# Patient Record
Sex: Female | Born: 1955 | Race: White | Hispanic: No | Marital: Married | Smoking: Never smoker
Health system: Southern US, Community
[De-identification: ages and names within clinical notes are randomized; demographics above are authoritative.]

## PROBLEM LIST (undated history)

## (undated) DIAGNOSIS — E079 Disorder of thyroid, unspecified: Secondary | ICD-10-CM

## (undated) HISTORY — PX: SPINE SURGERY: SHX786

## (undated) HISTORY — PX: OTHER SURGICAL HISTORY: SHX169

## (undated) HISTORY — DX: Disorder of thyroid, unspecified: E07.9

## (undated) HISTORY — PX: UTERINE FIBROID SURGERY: SHX826

---

## 1999-04-20 ENCOUNTER — Encounter: Payer: Self-pay | Admitting: Obstetrics and Gynecology

## 1999-04-20 ENCOUNTER — Ambulatory Visit (HOSPITAL_COMMUNITY): Admission: RE | Admit: 1999-04-20 | Discharge: 1999-04-20 | Payer: Self-pay | Admitting: Obstetrics and Gynecology

## 1999-12-16 ENCOUNTER — Ambulatory Visit (HOSPITAL_COMMUNITY): Admission: RE | Admit: 1999-12-16 | Discharge: 1999-12-16 | Payer: Self-pay

## 2000-02-10 ENCOUNTER — Encounter: Payer: Self-pay | Admitting: Gastroenterology

## 2000-02-10 ENCOUNTER — Ambulatory Visit (HOSPITAL_COMMUNITY): Admission: RE | Admit: 2000-02-10 | Discharge: 2000-02-10 | Payer: Self-pay | Admitting: Gastroenterology

## 2000-02-20 ENCOUNTER — Encounter (INDEPENDENT_AMBULATORY_CARE_PROVIDER_SITE_OTHER): Payer: Self-pay

## 2000-02-20 ENCOUNTER — Inpatient Hospital Stay (HOSPITAL_COMMUNITY): Admission: RE | Admit: 2000-02-20 | Discharge: 2000-02-22 | Payer: Self-pay | Admitting: Obstetrics and Gynecology

## 2000-05-09 ENCOUNTER — Encounter: Payer: Self-pay | Admitting: Gastroenterology

## 2000-05-09 ENCOUNTER — Ambulatory Visit (HOSPITAL_COMMUNITY): Admission: RE | Admit: 2000-05-09 | Discharge: 2000-05-09 | Payer: Self-pay | Admitting: Physical Therapy

## 2000-06-21 ENCOUNTER — Encounter: Payer: Self-pay | Admitting: Obstetrics and Gynecology

## 2000-06-21 ENCOUNTER — Ambulatory Visit (HOSPITAL_COMMUNITY): Admission: RE | Admit: 2000-06-21 | Discharge: 2000-06-21 | Payer: Self-pay | Admitting: Obstetrics and Gynecology

## 2001-04-22 ENCOUNTER — Encounter: Payer: Self-pay | Admitting: Gastroenterology

## 2001-04-22 ENCOUNTER — Ambulatory Visit (HOSPITAL_COMMUNITY): Admission: RE | Admit: 2001-04-22 | Discharge: 2001-04-22 | Payer: Self-pay | Admitting: Gastroenterology

## 2003-07-15 ENCOUNTER — Other Ambulatory Visit: Admission: RE | Admit: 2003-07-15 | Discharge: 2003-07-15 | Payer: Self-pay | Admitting: Obstetrics and Gynecology

## 2004-08-02 ENCOUNTER — Other Ambulatory Visit: Admission: RE | Admit: 2004-08-02 | Discharge: 2004-08-02 | Payer: Self-pay | Admitting: Obstetrics and Gynecology

## 2004-12-30 ENCOUNTER — Encounter: Admission: RE | Admit: 2004-12-30 | Discharge: 2004-12-30 | Payer: Self-pay | Admitting: Gastroenterology

## 2005-03-01 ENCOUNTER — Ambulatory Visit (HOSPITAL_COMMUNITY): Admission: RE | Admit: 2005-03-01 | Discharge: 2005-03-01 | Payer: Self-pay | Admitting: Gastroenterology

## 2005-06-22 ENCOUNTER — Encounter (HOSPITAL_COMMUNITY): Admission: RE | Admit: 2005-06-22 | Discharge: 2005-09-20 | Payer: Self-pay | Admitting: Endocrinology

## 2005-08-16 ENCOUNTER — Other Ambulatory Visit: Admission: RE | Admit: 2005-08-16 | Discharge: 2005-08-16 | Payer: Self-pay | Admitting: Obstetrics and Gynecology

## 2005-09-02 ENCOUNTER — Emergency Department (HOSPITAL_COMMUNITY): Admission: EM | Admit: 2005-09-02 | Discharge: 2005-09-02 | Payer: Self-pay | Admitting: Emergency Medicine

## 2007-02-21 ENCOUNTER — Observation Stay (HOSPITAL_COMMUNITY): Admission: AD | Admit: 2007-02-21 | Discharge: 2007-02-22 | Payer: Self-pay | Admitting: Obstetrics and Gynecology

## 2007-02-21 ENCOUNTER — Ambulatory Visit (HOSPITAL_COMMUNITY): Admission: RE | Admit: 2007-02-21 | Discharge: 2007-02-21 | Payer: Self-pay | Admitting: Obstetrics and Gynecology

## 2007-02-21 ENCOUNTER — Encounter (INDEPENDENT_AMBULATORY_CARE_PROVIDER_SITE_OTHER): Payer: Self-pay | Admitting: Specialist

## 2008-09-11 ENCOUNTER — Encounter: Admission: RE | Admit: 2008-09-11 | Discharge: 2008-09-11 | Payer: Self-pay | Admitting: Family Medicine

## 2008-09-26 ENCOUNTER — Encounter: Admission: RE | Admit: 2008-09-26 | Discharge: 2008-09-26 | Payer: Self-pay | Admitting: Neurological Surgery

## 2008-10-30 ENCOUNTER — Ambulatory Visit (HOSPITAL_COMMUNITY): Admission: RE | Admit: 2008-10-30 | Discharge: 2008-10-31 | Payer: Self-pay | Admitting: Neurological Surgery

## 2008-12-01 ENCOUNTER — Encounter: Admission: RE | Admit: 2008-12-01 | Discharge: 2008-12-01 | Payer: Self-pay | Admitting: Neurological Surgery

## 2009-02-01 ENCOUNTER — Encounter: Admission: RE | Admit: 2009-02-01 | Discharge: 2009-02-01 | Payer: Self-pay | Admitting: Neurological Surgery

## 2009-02-08 ENCOUNTER — Encounter: Admission: RE | Admit: 2009-02-08 | Discharge: 2009-02-08 | Payer: Self-pay | Admitting: Neurological Surgery

## 2009-05-21 ENCOUNTER — Encounter: Admission: RE | Admit: 2009-05-21 | Discharge: 2009-08-19 | Payer: Self-pay | Admitting: Neurological Surgery

## 2009-08-30 ENCOUNTER — Encounter: Admission: RE | Admit: 2009-08-30 | Discharge: 2009-08-30 | Payer: Self-pay | Admitting: Neurological Surgery

## 2010-11-11 ENCOUNTER — Encounter: Payer: Self-pay | Admitting: Physician Assistant

## 2010-11-13 ENCOUNTER — Encounter: Payer: Self-pay | Admitting: Family Medicine

## 2010-11-23 ENCOUNTER — Ambulatory Visit: Admit: 2010-11-23 | Payer: Self-pay

## 2010-11-23 ENCOUNTER — Encounter: Payer: Self-pay | Admitting: Physician Assistant

## 2010-11-23 ENCOUNTER — Ambulatory Visit: Admit: 2010-11-23 | Payer: Self-pay | Admitting: Physician Assistant

## 2010-12-01 DIAGNOSIS — E039 Hypothyroidism, unspecified: Secondary | ICD-10-CM | POA: Insufficient documentation

## 2010-12-02 ENCOUNTER — Encounter: Payer: Self-pay | Admitting: Physician Assistant

## 2010-12-02 ENCOUNTER — Encounter: Payer: Self-pay | Admitting: Cardiovascular Disease

## 2010-12-02 ENCOUNTER — Encounter (INDEPENDENT_AMBULATORY_CARE_PROVIDER_SITE_OTHER): Payer: Managed Care, Other (non HMO) | Admitting: Physician Assistant

## 2010-12-02 ENCOUNTER — Encounter (INDEPENDENT_AMBULATORY_CARE_PROVIDER_SITE_OTHER): Payer: Managed Care, Other (non HMO)

## 2010-12-02 DIAGNOSIS — Z8249 Family history of ischemic heart disease and other diseases of the circulatory system: Secondary | ICD-10-CM

## 2010-12-02 DIAGNOSIS — R0989 Other specified symptoms and signs involving the circulatory and respiratory systems: Secondary | ICD-10-CM

## 2010-12-07 ENCOUNTER — Encounter: Payer: Self-pay | Admitting: Cardiovascular Disease

## 2010-12-08 NOTE — Miscellaneous (Signed)
Summary: Orders Update  Clinical Lists Changes  Problems: Added new problem of CAROTID BRUIT (ICD-785.9) Orders: Added new Test order of Carotid Duplex (Carotid Duplex) - Signed 

## 2011-02-06 LAB — CBC
HCT: 41.7 % (ref 36.0–46.0)
Hemoglobin: 13.8 g/dL (ref 12.0–15.0)
MCV: 88.7 fL (ref 78.0–100.0)
Platelets: 187 10*3/uL (ref 150–400)
WBC: 6.5 10*3/uL (ref 4.0–10.5)

## 2011-02-06 LAB — DIFFERENTIAL
Lymphocytes Relative: 29 % (ref 12–46)
Monocytes Absolute: 0.3 10*3/uL (ref 0.1–1.0)
Monocytes Relative: 4 % (ref 3–12)
Neutro Abs: 4.1 10*3/uL (ref 1.7–7.7)

## 2011-02-06 LAB — BASIC METABOLIC PANEL
BUN: 14 mg/dL (ref 6–23)
CO2: 25 mEq/L (ref 19–32)
Calcium: 9.2 mg/dL (ref 8.4–10.5)
Glucose, Bld: 120 mg/dL — ABNORMAL HIGH (ref 70–99)
Potassium: 4 mEq/L (ref 3.5–5.1)
Sodium: 135 mEq/L (ref 135–145)

## 2011-02-06 LAB — APTT: aPTT: 33 seconds (ref 24–37)

## 2011-02-06 LAB — PROTIME-INR: Prothrombin Time: 13.7 seconds (ref 11.6–15.2)

## 2011-03-07 NOTE — Op Note (Signed)
Christina Barnes, Christina Barnes                 ACCOUNT NO.:  [id number]   MEDICAL RECORD NO.:  [id number]          PATIENT TYPE:  OIB   LOCATION:  3526                         FACILITY:  [facility]   PHYSICIAN:  Tia Alert, MD     DATE OF BIRTH:  1956/02/11   DATE OF PROCEDURE:  10/30/2008  DATE OF DISCHARGE:                               OPERATIVE REPORT   PREOPERATIVE DIAGNOSES:  Cervical spondylosis with neural foraminal  stenosis C5-6 with neck and left arm pain.   POSTOPERATIVE DIAGNOSIS:  Cervical spondylosis with neural foraminal  stenosis C5-6 with neck and left arm pain.   PROCEDURES:  1. Decompressive anterior cervical diskectomy C5-6.  2. Anterior cervical arthrodesis C5-6 utilizing a 6-mm PEEK interbody      cage packed with local autograft and Actifuse putty and Trinity      Plus.  3. Anterior cervical plating C5-6 utilizing a 23-mm Atlantis Venture      plate.   SURGEON:  Tia Alert, MD   ASSISTANT:  Kathaleen Maser. Pool, MD   ANESTHESIA:  General endotracheal.   COMPLICATIONS:  None apparent.   INDICATIONS FOR THE PROCEDURE:  [name] is a 55 year old female who  presented with neck pain with severe left arm pain.  She had an MRI,  which showed cervical spondylosis at C5-6 with neural foraminal  stenosis.  She had an MRI of the shoulder, which was normal.  She tried  medical management for a quite some time without significant relief, so  I recommended an anterior cervical diskectomy, fusion, and plating C5-6  in hopes of improving her pain syndrome.  She understood the risks,  benefits, and expected outcome and wished to proceed.   DESCRIPTION OF THE PROCEDURE:  The patient was taken to the operating  room, and after induction of adequate generalized endotracheal  anesthesia, she was placed in supine position on the operating room  table.  Her right anterior cervical region was prepped with DuraPrep and  then draped in the usual sterile fashion.  A 5 mL of local  anesthesia  was injected and a transverse incision was made to the right of midline  and carried down to the platysma, which was elevated, opened, and  undermined with Metzenbaum scissors.  I then dissected in a plane medial  to the sternocleidomastoid muscle and internal carotid artery and  lateral to the trachea and esophagus to expose C5-6.  Intraoperative  fluoroscopy confirmed my level at C5-6 and then the longus colli muscles  were taken down and the Shadow-Line retractors were placed under this to  expose C5-6.  The annulus was incised and the initial diskectomy was  done with pituitary rongeurs and curved curettes.  The anterior-inferior  endplate of C5 was bitten away with a 3-mm Kerrison punch.  We then used  a high-speed drill to drill the endplates for later arthrodesis.  The  drill shavings were saved in a mucous trap for later arthrodesis.  We  drilled down to the level of the posterior longitudinal ligament,  brought in the operating microscope.  The ligament was  opened and then  removed in a circumferential fashion undercutting the bodies of C5 and  C6.  Bilateral foraminotomies were performed marching along the C6  endplate to identify the pedicles bilaterally and then marched along the  nerve root distally into the foramina.  Once our decompression was  complete, we palpated in a circumferential fashion with nerve hook to  assure adequate decompression.  The nerve hook passed easily into the  neural foramina.  We then measured the interspace to be 6 mm.  We used a  6-mm PEEK interbody cage, packed with local autograft, Actifuse putty,  and Trinity Plus and tapped this into position at C5-6.  We then used a  23-mm Atlantis Venture plate, placed two 13-mm variable angle screws in  the bodies of C5 and C6 and these locked into plate by locking mechanism  within the plate.  We then dried all bleeding points with bipolar  cautery, irrigated with saline solution containing  bacitracin.  Once  meticulous hemostasis was achieved, we closed the platysma with 3-0  Vicryl, closed the subcuticular tissue with 3-0 Vicryl, and closed the  skin with benzoin and Steri-Strips.  The drapes were removed.  Sterile  dressing was applied.  The patient was awakened from general anesthesia  and transferred to the recovery room in stable condition.  At the end of  the procedure, all sponge, needle, and instrument counts were correct.      Tia Alert, MD  Electronically Signed     DSJ/MEDQ  D:  10/30/2008  T:  10/30/2008  Job:  [id number]

## 2011-03-10 NOTE — H&P (Signed)
NAME:  Christina Barnes, Christina Barnes                 ACCOUNT NO.:  [id number]   MEDICAL RECORD NO.:  [id number]          PATIENT TYPE:  AMB   LOCATION:  SDC                           FACILITY:  [facility]   PHYSICIAN:  Guy Sandifer. Henderson Cloud, M.D. DATE OF BIRTH:  28-Mar-1956   DATE OF ADMISSION:  DATE OF DISCHARGE:                              HISTORY & PHYSICAL   CHIEF COMPLAINT:  Heavy menses and desires permanent sterilization.   HISTORY OF PRESENT ILLNESS:  This patient is a 55 year old married white  female, G1, P1, who is having progressively heavier menses.  Ultrasound  in my office on December 24, 2006, is consistent with a uterus measuring  10.5 x 4.5 x 6.0 cm.  There were multiple uterine leiomyomata ranging  from 11 to 30 mm.  The ovaries appeared normal.  Sonohysterogram  revealed a 2.6 cm polypoid-type mass in the endometrial canal.  The  patient also desires permanent sterilization.  After discussion of the  options, she is being admitted for laparoscopy with bilateral tubal  ligation with Filshie clips, hysteroscopy, dilatation and curettage and  NovaSure endometrial ablation.  The potential risks and complications  have been discussed with the patient preoperatively.   PAST MEDICAL HISTORY:  Hypothyroidism.   PAST SURGICAL HISTORY:  Negative.   FAMILY HISTORY:  Positive for renal carcinoma in mother.   OBSTETRICAL HISTORY:  Vaginal delivery x1.   MEDICATIONS:  1. Birth control pill daily.  2. Synthroid 0.137 mg daily.   ALLERGIES:  PENICILLIN leading to hives.   SOCIAL HISTORY:  Denies tobacco, alcohol or drug abuse.   REVIEW OF SYSTEMS:  NEUROLOGIC:  Denies headache.  CARDIAC:  Denies  chest pain.  PULMONARY:  Denies shortness of breath.  GI:  Denies recent  changes in bowel habits.   PHYSICAL EXAMINATION:  VITAL SIGNS:  Height 5 feet 0 inches.  Weight 140  pounds.  Blood pressure 100/60.  HEENT:  Without thyromegaly.  LUNGS:  Clear to auscultation.  HEART:  Regular rate and rhythm.  BACK:  Without CVA tenderness.  BREASTS:  Without mass, retraction or discharge.  ABDOMEN:  Soft, nontender, without masses.  PELVIC:  Vulva, vagina and cervix without lesion.  The uterus is about 8  weeks in size.  Adnexa nontender without palpable masses.  EXTREMITIES:  Grossly within normal limits.  NEUROLOGIC:  Grossly within normal limits.   ASSESSMENT:  1. Menorrhagia.  2. Desires permanent sterilization.   PLAN:  Laparoscopy, bilateral tubal ligation, hysteroscopy, dilatation  and curettage, and NovaSure endometrial ablation.      Guy Sandifer Henderson Cloud, M.D.  Electronically Signed     JET/MEDQ  D:  02/19/2007  T:  02/19/2007  Job:  [id number]

## 2011-03-10 NOTE — Op Note (Signed)
NAME:  Christina Barnes, Christina Barnes                 ACCOUNT NO.:  [id number]   MEDICAL RECORD NO.:  [id number]          PATIENT TYPE:  AMB   LOCATION:  SDC                           FACILITY:  [facility]   PHYSICIAN:  Guy Sandifer. Henderson Cloud, M.D. DATE OF BIRTH:  01/14/1956   DATE OF PROCEDURE:  02/21/2007  DATE OF DISCHARGE:                               OPERATIVE REPORT   PREOPERATIVE DIAGNOSES:  1. Menorrhagia.  2. Desires permanent sterilization.   POSTOPERATIVE DIAGNOSES:  1. Menorrhagia.  2. Desires permanent sterilization.   PROCEDURE:  NovaSure endometrial ablation, hysteroscopy with resection  of endometrial polyps, dilatation and curettage, laparoscopy with  bilateral tubal ligation with Filshie clips, lysis of adhesions, and 1%  Xylocaine paracervical block.   SURGEON:  Guy Sandifer. Henderson Cloud, M.D.   ANESTHESIA:  General with endotracheal intubation by Belva Agee,  M.D.   SPECIMENS:  Endometrial curettings and endometrial polyps, all to  pathology.   ESTIMATED BLOOD LOSS:  Minimal.   IN'S AND OUT'S OF DISTENDING MEDIA:  200 mL deficit, with most of that  on the floor.   INDICATIONS AND CONSENT:  This patient is a 55 year old married white  female G1, P1 with progressively heavier menses.  Details are dictated  in the history and physical.  She desires permanent sterilization.  Alternate methods have been reviewed.  The potential risks and  complications have been discussed preoperatively, including but limited  to, infection, uterine perforation, organ damage, bleeding requiring  transfusion of blood products with possible transfusion reaction, HIV  and hepatitis acquisition, DVT, PE, and pneumonia.  Permanence, failure  rate, and increased ectopic risk of tubal ligation has been reviewed.  Success and failure rate of the endometrial ablation has been reviewed.  All questions have been answered, and consent is signed on the chart.   FINDINGS:  There is a 2.5-cm polypoid structure arising  from the right  posterior mid-endometrial canal.  There is an 8-mm fleshy polypoid  process in front of the left fallopian tube ostia.  Abdominally, the  uterus contains several 5 to 10-mm subserosal leiomyomata.  The left  tube and ovary is normal.  The anterior cul-de-sac is normal.  The right  fallopian tube is adherent over the surface of the right ovary, and the  ovary is adherent to the right pelvic sidewall.   PROCEDURE:  The patient is taken to operating room where she is  identified, placed in the dorsal position, and general anesthesia is  induced via endotracheal intubation.  She is then prepped abdominally  and vaginally.  The bladder is straight catheterized, and she is draped  in a sterile fashion.  A bivalve speculum is placed in the vagina.  The  anterior cervical lip is injected with 1% Xylocaine and grasped with a  single-tooth tenaculum.  A paracervical block is placed at 2, 4, 5, 7,  8, and 10 o'clock positions with approximately 20 mL total of 1% plain  Xylocaine.  The uterus sounds to 10 cm, and the cervical canal sounds to  5 cm.  The cervix is gently progressively dilated.  The  diagnostic  hysteroscope is placed into the endocervical canal and advanced under  direct visualization.  The above findings are noted.  The diagnostic  hysteroscope is withdrawn and replaced with the resectoscope with a  single right-angle wire loop.  Using sorbitol distending media, the  polyps are both resected in a simple fashion without difficulty.  Then  the distending media is changed to LR and is allowed to thoroughly flush  the endometrial cavity.  The hysteroscope is withdrawn, and sharp  curettage is carried out.  The NovaSure device is then placed, and the  endometrial cavity width of 4 cm is noted.  The cavity test is  successfully passed on the first attempt.  Endometrial ablation is then  carried out for a total of 1 minute 23 seconds, and the machine cycles  off in the  normal fashion.  The NovaSure device is removed intact, and a  Hulka tenaculum is placed.   Attention is then turned to the abdomen.  The infraumbilical and  suprapubic areas are injected in the midline with 0.5% plain Marcaine.  A small infraumbilical incision is made.  A disposable Veress needle is  placed on the first attempt, and a normal syringe and drop test is  noted.  2 liters of gas are insufflated under low pressure with good  tympany in the right upper quadrant.  The Veress needle is removed, and  a 10/11 XL bladeless disposable trocar sleeve is placed using direct  visualization with the diagnostic scope.  After placement, it is  switched to the operative laparoscope.  A small suprapubic incision is  made in the midline, and a 5-mm bladeless XL disposable trocar sleeve is  placed under direct visualization without difficulty.  The above  findings are noted.  Sharp and blunt dissection is used to take down the  adhesions around the right adnexa.  This allows identification of the  right fallopian tube from cornu to fimbria.  The adhesions of the right  ovary to the pelvic sidewall are also taken down sharply and bluntly.  This is done clear of the course of the ureter.  The Filshie clip  applier is placed and Filshie clips are placed on the proximal one-third  of the fallopian tube bilaterally.  The Filshie clip applier is then  removed, and bipolar cautery is used to obtain complete hemostasis  around the right fallopian tube where the areas of dissection had been  carried out.  Careful inspection reveals the entire fallopian tube is  within the clip bilaterally, and the heel of the clip can be  transilluminated through the mesosalpinx bilaterally.  Copious  irrigation is carried out, and all return is clear.  The suprapubic  trocar sleeve is removed.  Pneumoperitoneum is reduced, and the  umbilical trocar sleeve is removed.  The umbilical incision is closed with subcuticular  2-0 Vicryl suture.  Dermabond is placed on both  incisions.  The Hulka tenaculum is removed, and no bleeding is noted.  All counts are correct.   The patient is awakened and taken to the recovery room in stable  condition.      Guy Sandifer Henderson Cloud, M.D.  Electronically Signed     JET/MEDQ  D:  02/21/2007  T:  02/21/2007  Job:  [id number]

## 2011-03-10 NOTE — Op Note (Signed)
[facility]  Patient:    Christina Barnes, Christina Barnes                MRN: [id number] Proc. Date: 02/20/00 Adm. Date:  [id number] Attending:  Rhina Brackett                           Operative Report  PREOPERATIVE DIAGNOSIS:  Symptomatic leiomyoma.  POSTOPERATIVE DIAGNOSIS:  Symptomatic leiomyoma.  OPERATION:  Myomectomy.  SURGEON:  Duke Salvia. Marcelle Overlie, M.D.  ASSISTANT:  Guy Sandifer. Arleta Creek, M.D.  ANESTHESIA:  General endotracheal.  COMPLICATIONS:  None.  DRAINS:  Foley catheter.  ESTIMATED BLOOD LOSS:  250 cc.  DESCRIPTION OF PROCEDURE:  The patient was taken to the operating room.  After an adequate level of general endotracheal anesthesia was obtained with the patient in supine position, the abdomen was prepped and draped in the usual manner for sterile abdominal procedures.  Foley catheter was positioned draining clear urine.  She then received preoperative Cefotan 1 gm IV.  A Pfannenstiel incision was made two fingerbreadths above the symphysis and carried down to the fascia which was incised and extended transversely.  The rectus muscles were divided in the midline.  The peritoneum insufflated without incident and extended in a vertical manner.  An OSullivan-OConnor retractor was positioned, bowels packed superiorly out of the field.  Bladder blade was inserted.  Pelvic findings were as follows.  The right uterine cornua posteriorly was enlarged and deviated with a 4 to 5 cm fibroid which was somewhat softened to palpation suggesting it had degenerated.  There was a very small fibroid posteriorly just above the cervix and a 2 cm fibroid anteriorly just above the bladder reflection.  Dilute pitressin solution was injected in the posterior right cornua.  A small incision was made and the degenerated fibroid was excised.  0 Dexon sutures were used for the deep tissue reapproximation and hemostasis with a 3-0 Dexon interrupted on the  serosa.  The small posterior fibroid was just ablated superficially with the Bovie.  The anterior lower segment fibroid, the bladder flap peritoneum was opened and pushed inferiorly and the fibroid was enucleated and sutured with 0 Dexon deep interrupted suture and 3-0 Dexon on the serosal aspect.  These areas were inspected and noted to be hemostatic. There were no other fibroids noted. Tubes and ovaries were normal.  Pelvis was irrigated with saline and aspirated. All operative sites were inspected and noted to be hemostatic.  Interceed was applied across the incision sites on the uterus.  Instruments were removed along with the retractor.  Prior to closure, sponge, needle and instrument counts were reported as correct x 2. Rectus muscles were reapproximated in the midline with a 3-0 Dexon suture. Fascia was closed from laterally to midline using 0 Dexon running suture. Clips and Steri-Strips were used on the skin.  The patient tolerated the procedure well and went to the recovery room in good condition. DD:  02/20/00 TD:  02/20/00 Job: [id number]

## 2011-03-10 NOTE — Discharge Summary (Signed)
[facility]  Patient:    Christina Barnes, Christina Barnes                          MRN: [id number] Adm. Date:  02/20/00 Disc. Date: 02/22/00 Attending:  Duke Salvia. Marcelle Overlie, M.D.                           Discharge Summary  DISCHARGE DIAGNOSES: 1. Symptomatic leiomyoma. 2. Myomectomy this admission.  HISTORY OF PRESENT ILLNESS:  See admission history and physical for details. Briefly, a 55 year old G1, P1 who has had a recent evaluation for pelvic and abdominal pain by her family doctor. During that time, increasing leiomyoma were noted on ultrasound examination. She has had worsening problems with pelvic pain and presents now for myomectomy.  H0OSPITAL COURSE:  On February 20, 2000 under general anesthesia, the patient underwent laparotomy and myomectomy dissecting one large partially degenerated right cornual fibroid and another small anterior serosal fibroid. Both tubes and ovaries appears to be normal at the time. Her postoperative course was uneventful. On the first postoperative day, Feb 21, 2000, she was afebrile, hemoglobin 10.6. Her diet was slowly increased. By the evening of May 2, she was afebrile tolerating a regular diet and had established normal bowel and urinary function again remained afebrile. The incision was clean and dry, no evidence of cellulitis and she was  ready for discharge at that point.  LABORATORY DATA:  The pathology report showed two benign leiomyoma. Her preop hemoglobin was 15.1, postop was 10.6. UPT was negative, urinalysis was negative.  DISPOSITION:  The patient was discharged on Hemocyte once daily for a month. The clips will be left in and she will return to our office in four days to have the clips removed. Tylox p.r.n. pain. I advised her for any incisional redness, drainage, increased pain or bleeding or any fever over 101. She was given other  specific instructions regarding diet, sex, and exercise.  CONDITION ON  DISCHARGE:  Good.  DISCHARGE ACTIVITY:  Graded increase. DD:  02/22/00 TD:  02/25/00 Job: [id number]

## 2011-03-10 NOTE — Op Note (Signed)
NAME:  Christina Barnes, Christina Barnes                 ACCOUNT NO.:  [id number]   MEDICAL RECORD NO.:  [id number]          PATIENT TYPE:  AMB   LOCATION:  ENDO                         FACILITY:  [facility]   PHYSICIAN:  John C. Madilyn Fireman, M.D.    DATE OF BIRTH:  [birth year]   DATE OF PROCEDURE:  03/01/2005  DATE OF DISCHARGE:                                 OPERATIVE REPORT   PROCEDURE:  Colonoscopy.   INDICATIONS FOR PROCEDURE:  Heme-positive stools.   DESCRIPTION OF PROCEDURE:  The patient was placed in the left lateral  decubitus position then placed on the pulse monitor with continuous low-flow  oxygen delivered by nasal cannula. She was sedated with 75 mcg IV fentanyl  and 10 mg IV Versed. The Olympus video colonoscope was inserted into the  rectum and advanced to the cecum, confirmed by transillumination at  McBurney's point and visualization at the ileocecal valve and appendiceal  orifice. The prep was good. The cecum, ascending, transverse, descending and  sigmoid colon all appeared normal with no masses, polyps, diverticula or  other mucosal abnormalities. The rectum likewise appeared normal and  retroflexed view of the anus revealed only small internal hemorrhoids. The  scope was then withdrawn and the patient returned to the recovery room in  stable condition. She tolerated the procedure well and there were no  immediate complications.   IMPRESSION:  Internal hemorrhoids otherwise normal study.   PLAN:  Next colon screening in five years by sigmoidoscopy or colonoscopy.      JCH/MEDQ  D:  03/01/2005  T:  03/01/2005  Job:  [id number]

## 2012-02-23 LAB — HM MAMMOGRAPHY: HM MAMMO: NEGATIVE

## 2012-03-06 ENCOUNTER — Other Ambulatory Visit: Payer: Self-pay | Admitting: Obstetrics and Gynecology

## 2012-10-18 ENCOUNTER — Other Ambulatory Visit: Payer: Self-pay | Admitting: Gastroenterology

## 2012-10-18 DIAGNOSIS — R634 Abnormal weight loss: Secondary | ICD-10-CM

## 2012-10-18 DIAGNOSIS — R109 Unspecified abdominal pain: Secondary | ICD-10-CM

## 2012-10-21 ENCOUNTER — Ambulatory Visit
Admission: RE | Admit: 2012-10-21 | Discharge: 2012-10-21 | Disposition: A | Discharge status: Home / Self Care | Payer: Managed Care, Other (non HMO) | Source: Ambulatory Visit | Attending: Gastroenterology | Admitting: Gastroenterology

## 2012-10-21 DIAGNOSIS — R634 Abnormal weight loss: Secondary | ICD-10-CM

## 2012-10-21 DIAGNOSIS — R109 Unspecified abdominal pain: Secondary | ICD-10-CM

## 2012-10-21 MED ORDER — IOHEXOL 300 MG/ML  SOLN
100.0000 mL | Freq: Once | INTRAMUSCULAR | Status: AC | PRN
  Administered 2012-10-21: 100 mL via INTRAVENOUS

## 2012-11-08 ENCOUNTER — Other Ambulatory Visit: Payer: Self-pay | Admitting: Gastroenterology

## 2013-01-15 ENCOUNTER — Ambulatory Visit: Payer: Self-pay

## 2013-01-17 ENCOUNTER — Ambulatory Visit (INDEPENDENT_AMBULATORY_CARE_PROVIDER_SITE_OTHER): Payer: BC Managed Care – PPO | Admitting: *Deleted

## 2013-01-17 DIAGNOSIS — D649 Anemia, unspecified: Secondary | ICD-10-CM

## 2013-01-17 MED ORDER — CYANOCOBALAMIN 1000 MCG/ML IJ SOLN
1000.0000 ug | INTRAMUSCULAR | Status: DC
  Administered 2013-01-17 – 2013-07-04 (×4): 1000 ug via INTRAMUSCULAR

## 2013-01-24 ENCOUNTER — Ambulatory Visit: Payer: BC Managed Care – PPO

## 2013-02-21 ENCOUNTER — Ambulatory Visit (INDEPENDENT_AMBULATORY_CARE_PROVIDER_SITE_OTHER): Payer: BC Managed Care – PPO | Admitting: *Deleted

## 2013-02-21 DIAGNOSIS — D649 Anemia, unspecified: Secondary | ICD-10-CM

## 2013-02-21 NOTE — Patient Instructions (Addendum)
Vitamin B12 Injections Every person needs vitamin B12. A deficiency develops when the body does not get enough of it. One way to overcome this is by getting B12 shots (injections). A B12 shot puts the vitamin directly into muscle tissue. This avoids any problems your body might have in absorbing it from food or a pill. In some people, the body has trouble using the vitamin correctly. This can cause a B12 deficiency. Not consuming enough of the vitamin can also cause a deficiency. Getting enough vitamin B12 can be hard for elderly people. Sometimes, they do not eat a well-balanced diet. The elderly are also more likely than younger people to have medical conditions or take medications that can lead to a deficiency. WHAT DOES VITAMIN B12 DO? Vitamin B12 does many things to help the body work right:  It helps the body make healthy red blood cells.  It helps maintain nerve cells.  It is involved in the body's process of converting food into energy (metabolism).  It is needed to make the genetic material in all cells (DNA). VITAMIN B12 FOOD SOURCES Most people get plenty of vitamin B12 through the foods they eat. It is present in:  Meat, fish, poultry, and eggs.  Milk and milk products.  It also is added when certain foods are made, including some breads, cereals and yogurts. The food is then called "fortified". CAUSES The most common causes of vitamin B12 deficiency are:  Pernicious anemia. The condition develops when the body cannot make enough healthy red blood cells. This stems from a lack of a protein made in the stomach (intrinsic factor). People without this protein cannot absorb enough vitamin B12 from food.  Malabsorption. This is when the body cannot absorb the vitamin. It can be caused by:  Pernicious anemia.  Surgery to remove part or all of the stomach can lead to malabsorption. Removal of part or all of the small intestine can also cause malabsorption.  Vegetarian diet.  People who are strict about not eating foods from animals could have trouble taking in enough vitamin B12 from diet alone.  Medications. Some medicines have been linked to B12 deficiency, such as Metformin (a drug prescribed for type 2 diabetes). Long-term use of stomach acid suppressants also can keep the vitamin from being absorbed.  Intestinal problems such as inflammatory bowel disease. If there are problems in the digestive tract, vitamin B12 may not be absorbed in good enough amounts. SYMPTOMS People who do not get enough B12 can develop problems. These can include:  Anemia. This is when the body has too few red blood cells. Red blood cells carry oxygen to the rest of the body. Without a healthy supply of red blood cells, people can feel:  Tired (fatigued).  Weak.  Severe anemia can cause:  Shortness of breath.  Dizziness.  Rapid heart rate.  Paleness.  Other Vitamin B12 deficiency symptoms include:  Diarrhea.  Numbness or tingling in the hands or feet.  Loss of appetite.  Confusion.  Sores on the tongue or in the mouth. LET YOUR CAREGIVER KNOW ABOUT:  Any allergies. It is very important to know if you are allergic or sensitive to cobalt. Vitamin B12 contains cobalt.  Any history of kidney disease.  All medications you are taking. Include prescription and over-the-counter medicines, herbs and creams.  Whether you are pregnant or breast-feeding.  If you have Leber's disease, a hereditary eye condition, vitamin B12 could make it worse. RISKS AND COMPLICATIONS Reactions to an injection are   usually temporary. They might include:  Pain at the injection site.  Redness, swelling or tenderness at the site.  Headache, dizziness or weakness.  Nausea, upset stomach or diarrhea.  Numbness or tingling.  Fever.  Joint pain.  Itching or rash. If a reaction does not go away in a short while, talk with your healthcare provider. A change in the way the shots are  given, or where they are given, might need to be made. BEFORE AN INJECTION To decide whether B12 injections are right for you, your healthcare provider will probably:  Ask about your medical history.  Ask questions about your diet.  Ask about symptoms such as:  Have you felt weak?  Do you feel unusually tired?  Do you get dizzy?  Order blood tests. These may include a test to:  Check the level of red cells in your blood.  Measure B12 levels.  Check for the presence of intrinsic factor. VITAMIN B12 INJECTIONS How often you will need a vitamin B12 injection will depend on how severe your deficiency is. This also will affect how long you will need to get them. People with pernicious anemia usually get injections for their entire life. Others might get them for a shorter period. For many people, injections are given daily or weekly for several weeks. Then, once B12 levels are normal, injections are given just once a month. If the cause of the deficiency can be fixed, the injections can be stopped. Talk with your healthcare provider about what you should expect. For an injection:  The injection site will be cleaned with an alcohol swab.  Your healthcare provider will insert a needle directly into a muscle. Most any muscle can be used. Most often, an arm muscle is used. A buttocks muscle can also be used. Many people say shots in that area are less painful.  A small adhesive bandage may be put over the injection site. It usually can be taken off in an hour or less. Injections can be given by your healthcare provider. In some cases, family members give them. Sometimes, people give them to themselves. Talk with your healthcare provider about what would be best for you. If someone other than your healthcare provider will be giving the shots, the person will need to be trained to give them correctly. HOME CARE INSTRUCTIONS   You can remove the adhesive bandage within an hour of getting a  shot.  You should be able to go about your normal activities right away.  Avoid drinking large amounts of alcohol while taking vitamin B12 shots. Alcohol can interfere with the body's use of the vitamin. SEEK MEDICAL CARE IF:   Pain, redness, swelling or tenderness at the injection site does not get better or gets worse.  Headache, dizziness or weakness does not go away.  You develop a fever of more than 100.5 F (38.1 C). SEEK IMMEDIATE MEDICAL CARE IF:   You have chest pain.  You develop shortness of breath.  You have muscle weakness that gets worse.  You develop numbness, weakness or tingling on one side or one area of the body.  You have symptoms of an allergic reaction, such as:  Hives.  Difficulty breathing.  Swelling of the lips, face, tongue or throat.  You develop a fever of more than 102.0 F (38.9 C). MAKE SURE YOU:   Understand these instructions.  Will watch your condition.  Will get help right away if you are not doing well or get worse. Document   Released: 01/05/2009 Document Revised: 01/01/2012 Document Reviewed: 01/05/2009 [employer] Patient Information 2013 [employer].  

## 2013-02-21 NOTE — Progress Notes (Signed)
Patient tolerated well.

## 2013-05-09 ENCOUNTER — Ambulatory Visit (INDEPENDENT_AMBULATORY_CARE_PROVIDER_SITE_OTHER): Payer: BC Managed Care – PPO

## 2013-05-09 DIAGNOSIS — D649 Anemia, unspecified: Secondary | ICD-10-CM

## 2013-05-09 NOTE — Progress Notes (Signed)
Tolerated injection well. 

## 2013-05-09 NOTE — Patient Instructions (Signed)
Vitamin B12 Injections Every person needs vitamin B12. A deficiency develops when the body does not get enough of it. One way to overcome this is by getting B12 shots (injections). A B12 shot puts the vitamin directly into muscle tissue. This avoids any problems your body might have in absorbing it from food or a pill. In some people, the body has trouble using the vitamin correctly. This can cause a B12 deficiency. Not consuming enough of the vitamin can also cause a deficiency. Getting enough vitamin B12 can be hard for elderly people. Sometimes, they do not eat a well-balanced diet. The elderly are also more likely than younger people to have medical conditions or take medications that can lead to a deficiency. WHAT DOES VITAMIN B12 DO? Vitamin B12 does many things to help the body work right:  It helps the body make healthy red blood cells.  It helps maintain nerve cells.  It is involved in the body's process of converting food into energy (metabolism).  It is needed to make the genetic material in all cells (DNA). VITAMIN B12 FOOD SOURCES Most people get plenty of vitamin B12 through the foods they eat. It is present in:  Meat, fish, poultry, and eggs.  Milk and milk products.  It also is added when certain foods are made, including some breads, cereals and yogurts. The food is then called "fortified". CAUSES The most common causes of vitamin B12 deficiency are:  Pernicious anemia. The condition develops when the body cannot make enough healthy red blood cells. This stems from a lack of a protein made in the stomach (intrinsic factor). People without this protein cannot absorb enough vitamin B12 from food.  Malabsorption. This is when the body cannot absorb the vitamin. It can be caused by:  Pernicious anemia.  Surgery to remove part or all of the stomach can lead to malabsorption. Removal of part or all of the small intestine can also cause malabsorption.  Vegetarian diet.  People who are strict about not eating foods from animals could have trouble taking in enough vitamin B12 from diet alone.  Medications. Some medicines have been linked to B12 deficiency, such as Metformin (a drug prescribed for type 2 diabetes). Long-term use of stomach acid suppressants also can keep the vitamin from being absorbed.  Intestinal problems such as inflammatory bowel disease. If there are problems in the digestive tract, vitamin B12 may not be absorbed in good enough amounts. SYMPTOMS People who do not get enough B12 can develop problems. These can include:  Anemia. This is when the body has too few red blood cells. Red blood cells carry oxygen to the rest of the body. Without a healthy supply of red blood cells, people can feel:  Tired (fatigued).  Weak.  Severe anemia can cause:  Shortness of breath.  Dizziness.  Rapid heart rate.  Paleness.  Other Vitamin B12 deficiency symptoms include:  Diarrhea.  Numbness or tingling in the hands or feet.  Loss of appetite.  Confusion.  Sores on the tongue or in the mouth. LET YOUR CAREGIVER KNOW ABOUT:  Any allergies. It is very important to know if you are allergic or sensitive to cobalt. Vitamin B12 contains cobalt.  Any history of kidney disease.  All medications you are taking. Include prescription and over-the-counter medicines, herbs and creams.  Whether you are pregnant or breast-feeding.  If you have Leber's disease, a hereditary eye condition, vitamin B12 could make it worse. RISKS AND COMPLICATIONS Reactions to an injection are   usually temporary. They might include:  Pain at the injection site.  Redness, swelling or tenderness at the site.  Headache, dizziness or weakness.  Nausea, upset stomach or diarrhea.  Numbness or tingling.  Fever.  Joint pain.  Itching or rash. If a reaction does not go away in a short while, talk with your healthcare provider. A change in the way the shots are  given, or where they are given, might need to be made. BEFORE AN INJECTION To decide whether B12 injections are right for you, your healthcare provider will probably:  Ask about your medical history.  Ask questions about your diet.  Ask about symptoms such as:  Have you felt weak?  Do you feel unusually tired?  Do you get dizzy?  Order blood tests. These may include a test to:  Check the level of red cells in your blood.  Measure B12 levels.  Check for the presence of intrinsic factor. VITAMIN B12 INJECTIONS How often you will need a vitamin B12 injection will depend on how severe your deficiency is. This also will affect how long you will need to get them. People with pernicious anemia usually get injections for their entire life. Others might get them for a shorter period. For many people, injections are given daily or weekly for several weeks. Then, once B12 levels are normal, injections are given just once a month. If the cause of the deficiency can be fixed, the injections can be stopped. Talk with your healthcare provider about what you should expect. For an injection:  The injection site will be cleaned with an alcohol swab.  Your healthcare provider will insert a needle directly into a muscle. Most any muscle can be used. Most often, an arm muscle is used. A buttocks muscle can also be used. Many people say shots in that area are less painful.  A small adhesive bandage may be put over the injection site. It usually can be taken off in an hour or less. Injections can be given by your healthcare provider. In some cases, family members give them. Sometimes, people give them to themselves. Talk with your healthcare provider about what would be best for you. If someone other than your healthcare provider will be giving the shots, the person will need to be trained to give them correctly. HOME CARE INSTRUCTIONS   You can remove the adhesive bandage within an hour of getting a  shot.  You should be able to go about your normal activities right away.  Avoid drinking large amounts of alcohol while taking vitamin B12 shots. Alcohol can interfere with the body's use of the vitamin. SEEK MEDICAL CARE IF:   Pain, redness, swelling or tenderness at the injection site does not get better or gets worse.  Headache, dizziness or weakness does not go away.  You develop a fever of more than 100.5 F (38.1 C). SEEK IMMEDIATE MEDICAL CARE IF:   You have chest pain.  You develop shortness of breath.  You have muscle weakness that gets worse.  You develop numbness, weakness or tingling on one side or one area of the body.  You have symptoms of an allergic reaction, such as:  Hives.  Difficulty breathing.  Swelling of the lips, face, tongue or throat.  You develop a fever of more than 102.0 F (38.9 C). MAKE SURE YOU:   Understand these instructions.  Will watch your condition.  Will get help right away if you are not doing well or get worse. Document   Released: 01/05/2009 Document Revised: 01/01/2012 Document Reviewed: 01/05/2009 [employer] Patient Information 2014 [employer].  

## 2013-05-10 ENCOUNTER — Ambulatory Visit (INDEPENDENT_AMBULATORY_CARE_PROVIDER_SITE_OTHER): Payer: BC Managed Care – PPO | Admitting: Family Medicine

## 2013-05-10 VITALS — BP 105/67 | HR 65 | Temp 97.9°F | Wt 119.2 lb

## 2013-05-10 DIAGNOSIS — J019 Acute sinusitis, unspecified: Secondary | ICD-10-CM

## 2013-05-10 DIAGNOSIS — J309 Allergic rhinitis, unspecified: Secondary | ICD-10-CM

## 2013-05-10 DIAGNOSIS — J029 Acute pharyngitis, unspecified: Secondary | ICD-10-CM

## 2013-05-10 LAB — POCT RAPID STREP A (OFFICE): Rapid Strep A Screen: NEGATIVE

## 2013-05-10 MED ORDER — CETIRIZINE HCL 10 MG PO TABS: 10.0000 mg | ORAL_TABLET | Freq: Every day | ORAL | Status: DC

## 2013-05-10 MED ORDER — FLUTICASONE PROPIONATE 50 MCG/ACT NA SUSP: 2.0000 | Freq: Every day | NASAL | Status: DC

## 2013-05-10 MED ORDER — AZITHROMYCIN 250 MG PO TABS: ORAL_TABLET | ORAL | Status: DC

## 2013-05-10 NOTE — Progress Notes (Signed)
Patient ID: Christina Barnes, female   DOB: [birth year], 57 y.o.   MRN: [id number] SUBJECTIVE: CC: Chief Complaint  Patient presents with  . Acute Visit    sore throat congestion 3 -4 days draingage  slight yellow cough alot at night    HPI: As above, with pressure in the cheeks and forehead., severe sorethroat. No fever. No cough, no chest pain, no SOB.  Past Medical History  Diagnosis Date  . Thyroid disease    No past surgical history on file. No current outpatient prescriptions on file prior to visit.   Current Facility-Administered Medications on File Prior to Visit  Medication Dose Route Frequency Provider Last Rate Last Dose  . cyanocobalamin ((VITAMIN B-12)) injection 1,000 mcg  1,000 mcg Intramuscular Q30 days Horald Pollen, PA-C   1,000 mcg at 05/09/13 1533   Allergies  Allergen Reactions  . Augmentin (Amoxicillin-Pot Clavulanate)   . Doxycycline   . Erythromycin   . Penicillins     There is no immunization history on file for this patient. History   Social History  . Marital Status: Married    Spouse Name: N/A    Number of Children: N/A  . Years of Education: N/A   Occupational History  . Not on file.   Social History Main Topics  . Smoking status: Never Smoker   . Smokeless tobacco: Not on file  . Alcohol Use: Not on file  . Drug Use: Not on file  . Sexually Active: Not on file   Other Topics Concern  . Not on file   Social History Narrative  . No narrative on file    ROS: As above in the HPI. All other systems are stable or negative. Was interested in healthy eating  OBJECTIVE: APPEARANCE:  Patient in no acute distress.The patient appeared well nourished and normally developed. Acyanotic. Waist: VITAL SIGNS:BP 105/67  Pulse 65  Temp(Src) 97.9 F (36.6 C) (Oral)  Wt 119 lb 3.2 oz (54.069 kg)  WF  SKIN: warm and  Dry without overt rashes, tattoos and scars  HEAD and Neck: without JVD, Head and scalp: normal Eyes:No scleral  icterus. Fundi normal, eye movements normal. Ears: Auricle normal, canal normal, Tympanic membranes normal, insufflation normal. Nose: congested, boggy swollen nasal mucosa. Paranasal sinus pressure on palpation. Throat: very red, no exudates Neck & thyroid: normal  CHEST & LUNGS: Chest wall: normal Lungs: Clear  CVS: Reveals the PMI to be normally located. Regular rhythm, First and Second Heart sounds are normal,  absence of murmurs, rubs or gallops.  ABDOMEN:  Appearance: normal Benign, no organomegaly, no masses, no Abdominal Aortic enlargement. No Guarding , no rebound. No Bruits. Bowel sounds: normal  RECTAL: N/A GU: N/A  EXTREMETIES: nonedematous. MUSCULOSKELETAL:  Spine: normal Joints: intact  NEUROLOGIC: oriented to time,place and person; nonfocal.  ASSESSMENT: Sore throat - Plan: POCT rapid strep A  Sinusitis, acute - Plan: azithromycin (ZITHROMAX) 250 MG tablet  Allergic rhinitis - Plan: fluticasone (FLONASE) 50 MCG/ACT nasal spray, cetirizine (ZYRTEC) 10 MG tablet  PLAN: Orders Placed This Encounter  Procedures  . POCT rapid strep A   Results for orders placed in visit on 05/10/13  POCT RAPID STREP A (OFFICE)      Result Value Range   Rapid Strep A Screen Negative  Negative    Meds ordered this encounter  Medications  . levothyroxine (SYNTHROID, LEVOTHROID) 112 MCG tablet    Sig: Take 112 mcg by mouth daily before breakfast.  . fluticasone (FLONASE) 50  MCG/ACT nasal spray    Sig: Place 2 sprays into the nose daily.    Dispense:  16 g    Refill:  6  . azithromycin (ZITHROMAX) 250 MG tablet    Sig: Use as directed: 2 on Day 1 ; and 1 tab daily from Day 2 to 5.    Dispense:  6 tablet    Refill:  0  . cetirizine (ZYRTEC) 10 MG tablet    Sig: Take 1 tablet (10 mg total) by mouth daily.    Dispense:  30 tablet    Refill:  5        Dr Woodroe Mode Recommendations  Diet and Exercise discussed with patient.  For nutrition information, I  recommend books:  1).Eat to Live by Dr Monico Hoar. 2).Prevent and Reverse Heart Disease by Dr Suzzette Righter. 3) Dr Katherina Right Book:  Program to Reverse Diabetes  Exercise recommendations are:  If unable to walk, then the patient can exercise in a chair 3 times a day. By flapping arms like a bird gently and raising legs outwards to the front.  If ambulatory, the patient can go for walks for 30 minutes 3 times a week. Then increase the intensity and duration as tolerated.  Goal is to try to attain exercise frequency to 5 times a week.  If applicable: Best to perform resistance exercises (machines or weights) 2 days a week and cardio type exercises 3 days per week.  Return if symptoms worsen or fail to improve.  Arlyce Circle P. [place] Charon, M.D.

## 2013-05-26 ENCOUNTER — Ambulatory Visit (HOSPITAL_COMMUNITY)
Admission: RE | Admit: 2013-05-26 | Discharge: 2013-05-26 | Disposition: A | Discharge status: Home / Self Care | Payer: BC Managed Care – PPO | Source: Ambulatory Visit | Attending: Family Medicine | Admitting: Family Medicine

## 2013-05-26 ENCOUNTER — Ambulatory Visit (INDEPENDENT_AMBULATORY_CARE_PROVIDER_SITE_OTHER): Payer: BC Managed Care – PPO | Admitting: Family Medicine

## 2013-05-26 VITALS — BP 120/64 | HR 70 | Temp 97.5°F | Ht <= 58 in | Wt 119.0 lb

## 2013-05-26 DIAGNOSIS — E039 Hypothyroidism, unspecified: Secondary | ICD-10-CM

## 2013-05-26 DIAGNOSIS — I809 Phlebitis and thrombophlebitis of unspecified site: Secondary | ICD-10-CM

## 2013-05-26 DIAGNOSIS — M7989 Other specified soft tissue disorders: Secondary | ICD-10-CM | POA: Insufficient documentation

## 2013-05-26 DIAGNOSIS — R9389 Abnormal findings on diagnostic imaging of other specified body structures: Secondary | ICD-10-CM | POA: Insufficient documentation

## 2013-05-26 MED ORDER — LEVOTHYROXINE SODIUM 100 MCG PO TABS: 100.0000 ug | ORAL_TABLET | Freq: Every day | ORAL | Status: DC

## 2013-05-26 NOTE — Progress Notes (Signed)
Right lower extremity venous duplex completed.  Right:  No evidence of DVT, superficial thrombosis, or Baker's cyst.  Left:  Negative for DVT in the common femoral vein.  

## 2013-05-26 NOTE — Progress Notes (Signed)
Patient ID: Christina Barnes, female   DOB: 11/24/1955, 57 y.o.   MRN: [id number] SUBJECTIVE: CC: Chief Complaint  Patient presents with  . dvt?    right calf/ pains and cramps    HPI: [place] without stopping. Legs have been tight and cramping.the right leg had a swelling like a bruise. On the right calf and is concerned that it is a blood clot.  Past Medical History  Diagnosis Date  . Thyroid disease    Past Surgical History  Procedure Laterality Date  . Spine surgery      neck  . Fibroyd removal     History   Social History  . Marital Status: Married    Spouse Name: N/A    Number of Children: N/A  . Years of Education: N/A   Occupational History  . Not on file.   Social History Main Topics  . Smoking status: Never Smoker   . Smokeless tobacco: Not on file  . Alcohol Use: No  . Drug Use: No  . Sexually Active: Not on file   Other Topics Concern  . Not on file   Social History Narrative  . No narrative on file   History reviewed. No pertinent family history. No current outpatient prescriptions on file prior to visit.   Current Facility-Administered Medications on File Prior to Visit  Medication Dose Route Frequency Provider Last Rate Last Dose  . cyanocobalamin ((VITAMIN B-12)) injection 1,000 mcg  1,000 mcg Intramuscular Q30 days Horald Pollen, PA-C   1,000 mcg at 05/09/13 1533   Allergies  Allergen Reactions  . Augmentin (Amoxicillin-Pot Clavulanate)   . Doxycycline   . Erythromycin   . Penicillins     There is no immunization history on file for this patient. Prior to Admission medications   Medication Sig Start Date End Date Taking? Authorizing Provider  levothyroxine (SYNTHROID, LEVOTHROID) 100 MCG tablet Take 1 tablet (100 mcg total) by mouth daily before breakfast. 05/26/13  Yes Ileana Ladd, MD    ROS: As above in the HPI. All other systems are stable or  negative.  OBJECTIVE: APPEARANCE:  Patient in no acute distress.The patient appeared well nourished and normally developed. Acyanotic. Waist: VITAL SIGNS:BP 120/64  Pulse 70  Temp(Src) 97.5 F (36.4 C) (Oral)  Ht 2\' 3"  (0.686 m)  Wt 119 lb (53.978 kg)  BMI 114.7 kg/m2 WF   SKIN: warm and  Dry without overt rashes, tattoos and scars  HEAD and Neck: without JVD, Head and scalp: normal Eyes:No scleral icterus. Fundi normal, eye movements normal. Ears: Auricle normal, canal normal, Tympanic membranes normal, insufflation normal. Nose: normal Throat: normal Neck & thyroid: normal  CHEST & LUNGS: Chest wall: normal Lungs: Clear  CVS: Reveals the PMI to be normally located. Regular rhythm, First and Second Heart sounds are normal,  absence of murmurs, rubs or gallops.  Peripheral vasculature: Radial pulses: normal Dorsal pedis pulses: normal Posterior pulses: normal Right medial calf has a large varix which is tender and a hard center. At the level of the superior perforator.  ABDOMEN:  Appearance: normal Benign, no organomegaly, no masses, no Abdominal Aortic enlargement. No Guarding , no rebound. No Bruits. Bowel sounds: normal  RECTAL: N/A GU: N/A  EXTREMETIES: nonedematous. Both Femoral and Pedal pulses are normal. homan's negative.   MUSCULOSKELETAL:  Spine: normal Joints: intact  NEUROLOGIC: oriented to time,place and person; nonfocal. Strength is normal Sensory is normal Reflexes are normal  Cranial Nerves are normal.  ASSESSMENT: Hypothyroid - Plan: levothyroxine (SYNTHROID, LEVOTHROID) 100 MCG tablet  Thrombophlebitis - Plan: Lower Extremity Venous Duplex Right  PLAN: Orders Placed This Encounter  Procedures  . Lower Extremity Venous Duplex Right    Standing Status: Future     Number of Occurrences: 1     Standing Expiration Date: 05/26/2014    Scheduling Instructions:     [place] and leg swelling.     Large  thrombophlebitis proximal right leg.     Suspect DVT.    Order Specific Question:  Laterality    Answer:  Right    Order Specific Question:  Where should this test be performed:    Answer:  [facility]   Results for orders placed in visit on 05/10/13  POCT RAPID STREP A (OFFICE)      Result Value Range   Rapid Strep A Screen Negative  Negative    Await result.  Results called:negative for DVT.  Advised patient Elevate heat to leg.  Return in about 1 week (around 06/02/2013).  Chezney Huether P. [place] Charon, M.D.

## 2013-07-04 ENCOUNTER — Ambulatory Visit (INDEPENDENT_AMBULATORY_CARE_PROVIDER_SITE_OTHER): Payer: BC Managed Care – PPO

## 2013-07-04 DIAGNOSIS — D649 Anemia, unspecified: Secondary | ICD-10-CM

## 2013-07-07 NOTE — Progress Notes (Signed)
Patient tolerated well.

## 2014-04-02 DIAGNOSIS — D519 Vitamin B12 deficiency anemia, unspecified: Secondary | ICD-10-CM | POA: Insufficient documentation

## 2014-04-02 DIAGNOSIS — E785 Hyperlipidemia, unspecified: Secondary | ICD-10-CM | POA: Insufficient documentation

## 2014-04-02 DIAGNOSIS — E039 Hypothyroidism, unspecified: Secondary | ICD-10-CM | POA: Insufficient documentation

## 2014-06-16 ENCOUNTER — Ambulatory Visit (INDEPENDENT_AMBULATORY_CARE_PROVIDER_SITE_OTHER): Payer: BC Managed Care – PPO

## 2014-06-16 ENCOUNTER — Encounter (INDEPENDENT_AMBULATORY_CARE_PROVIDER_SITE_OTHER): Payer: Self-pay

## 2014-06-16 ENCOUNTER — Ambulatory Visit (INDEPENDENT_AMBULATORY_CARE_PROVIDER_SITE_OTHER): Payer: BC Managed Care – PPO | Admitting: Family Medicine

## 2014-06-16 ENCOUNTER — Encounter: Payer: Self-pay | Admitting: Family Medicine

## 2014-06-16 VITALS — BP 108/66 | HR 64 | Temp 99.4°F | Ht 61.0 in | Wt 122.0 lb

## 2014-06-16 DIAGNOSIS — R109 Unspecified abdominal pain: Secondary | ICD-10-CM

## 2014-06-16 LAB — POCT CBC
Granulocyte percent: 60.4 %G (ref 37–80)
HCT, POC: 42.6 % (ref 37.7–47.9)
Hemoglobin: 14 g/dL (ref 12.2–16.2)
Lymph, poc: 1.8 (ref 0.6–3.4)
MCH, POC: 28.9 pg (ref 27–31.2)
MCHC: 32.9 g/dL (ref 31.8–35.4)
MCV: 87.8 fL (ref 80–97)
MPV: 8.7 fL (ref 0–99.8)
POC Granulocyte: 3 (ref 2–6.9)
POC LYMPH PERCENT: 36.2 %L (ref 10–50)
Platelet Count, POC: 165 10*3/uL (ref 142–424)
RBC: 4.9 M/uL (ref 4.04–5.48)
RDW, POC: 13.9 %
WBC: 5 10*3/uL (ref 4.6–10.2)

## 2014-06-16 MED ORDER — MAGNESIUM CITRATE PO SOLN: 1.0000 | Freq: Once | ORAL | Status: DC

## 2014-06-16 MED ORDER — ONDANSETRON 4 MG PO TBDP: 4.0000 mg | ORAL_TABLET | Freq: Three times a day (TID) | ORAL | Status: DC | PRN

## 2014-06-16 NOTE — Progress Notes (Signed)
   Subjective:    Patient ID: Christina Barnes, female    DOB: [birth year], 57 y.o.   MRN: [id number]  HPI  C/o abdominal pain for a week and constipation.  She has been nauseated.   She denies fever.  Review of Systems No chest pain, SOB, HA, dizziness, vision change, N/V, diarrhea, constipation, dysuria, urinary urgency or frequency, myalgias, arthralgias or rash.     Objective:   Physical Exam  Vital signs noted  Well developed well nourished female.  HEENT - Head atraumatic Normocephalic                Eyes - PERRLA, Conjuctiva - clear Sclera- Clear EOMI                Ears - EAC's Wnl TM's Wnl Gross Hearing WNL                Nose - Nares patent                 Throat - oropharanx wnl Respiratory - Lungs CTA bilateral Cardiac - RRR S1 and S2 without murmur GI - Abdomen soft tender right upper quadrant and bowel sounds active x 4 Extremities - No edema. Neuro - Grossly intact.  KUB - constipation Prelimnary reading by Iverson Alamin  Results for orders placed in visit on 06/16/14  POCT CBC      Result Value Ref Range   WBC 5.0  4.6 - 10.2 K/uL   Lymph, poc 1.8  0.6 - 3.4   POC LYMPH PERCENT 36.2  10 - 50 %L   POC Granulocyte 3.0  2 - 6.9   Granulocyte percent 60.4  37 - 80 %G   RBC 4.9  4.04 - 5.48 M/uL   Hemoglobin 14.0  12.2 - 16.2 g/dL   HCT, POC 42.6  37.7 - 47.9 %   MCV 87.8  80 - 97 fL   MCH, POC 28.9  27 - 31.2 pg   MCHC 32.9  31.8 - 35.4 g/dL   RDW, POC 13.9     Platelet Count, POC 165.0  142 - 424 K/uL   MPV 8.7  0 - 99.8 fL      Assessment & Plan:  Abdominal pain, unspecified site - Plan: POCT CBC, DG Abd 1 View, magnesium citrate SOLN, US Abdomen Limited RUQ, ondansetron (ZOFRAN ODT) 4 MG disintegrating tablet  Discussed with patient that I think her abdominal pain is due to constipation and that she probably will feel better and not nauseated if she has better BM.  Lysbeth Penner FNP

## 2014-06-17 ENCOUNTER — Other Ambulatory Visit: Payer: Self-pay

## 2014-06-17 DIAGNOSIS — R109 Unspecified abdominal pain: Secondary | ICD-10-CM

## 2014-06-19 ENCOUNTER — Ambulatory Visit (HOSPITAL_COMMUNITY)
Admission: RE | Admit: 2014-06-19 | Discharge: 2014-06-19 | Disposition: A | Discharge status: Home / Self Care | Payer: BC Managed Care – PPO | Source: Ambulatory Visit | Attending: Family Medicine | Admitting: Family Medicine

## 2014-06-19 DIAGNOSIS — R109 Unspecified abdominal pain: Secondary | ICD-10-CM | POA: Insufficient documentation

## 2014-06-19 DIAGNOSIS — D1809 Hemangioma of other sites: Secondary | ICD-10-CM | POA: Insufficient documentation

## 2014-06-22 ENCOUNTER — Telehealth: Payer: Self-pay | Admitting: Family Medicine

## 2014-06-22 NOTE — Telephone Encounter (Signed)
Message copied by Waverly Ferrari on Mon Jun 22, 2014 10:18 AM ------      Message from: Lysbeth Penner      Created: Thu Jun 18, 2014  4:23 PM       Xray shows constipation and if no results from the mag citrate then would try some otc miralax or glycolax daily ------

## 2014-06-22 NOTE — Telephone Encounter (Signed)
Message copied by Waverly Ferrari on Mon Jun 22, 2014 10:18 AM ------      Message from: Lysbeth Penner      Created: Fri Jun 19, 2014 10:36 AM       GB US is negative for stone.  I think her nausea and abdominal pain were due to constipation.  Follow up if not better. ------

## 2014-06-23 ENCOUNTER — Other Ambulatory Visit: Payer: Self-pay | Admitting: Family Medicine

## 2014-06-23 DIAGNOSIS — G8929 Other chronic pain: Secondary | ICD-10-CM

## 2014-06-23 DIAGNOSIS — R1011 Right upper quadrant pain: Principal | ICD-10-CM

## 2014-06-23 NOTE — Telephone Encounter (Signed)
I have ordered a GI referral for her abdominal pain

## 2014-06-23 NOTE — Telephone Encounter (Signed)
Pt aware of [place] results.  She has being using the med she was given but is still having abdominal discomfort. Could ulcers do this??? Call her at [phone]

## 2014-06-23 NOTE — Telephone Encounter (Signed)
Please review last message  

## 2015-02-12 ENCOUNTER — Other Ambulatory Visit: Payer: Self-pay | Admitting: Obstetrics and Gynecology

## 2015-02-17 LAB — CYTOLOGY - PAP

## 2015-05-07 ENCOUNTER — Ambulatory Visit: Payer: Managed Care, Other (non HMO) | Admitting: Physical Therapy

## 2015-05-21 ENCOUNTER — Ambulatory Visit: Payer: Managed Care, Other (non HMO) | Admitting: Physical Therapy

## 2015-07-01 ENCOUNTER — Ambulatory Visit: Payer: Managed Care, Other (non HMO) | Admitting: Physical Therapy

## 2015-07-30 ENCOUNTER — Ambulatory Visit (INDEPENDENT_AMBULATORY_CARE_PROVIDER_SITE_OTHER): Payer: BLUE CROSS/BLUE SHIELD | Admitting: Family Medicine

## 2015-07-30 ENCOUNTER — Encounter: Payer: Self-pay | Admitting: Family Medicine

## 2015-07-30 VITALS — BP 125/76 | HR 64 | Temp 97.7°F | Ht 61.0 in | Wt 127.0 lb

## 2015-07-30 DIAGNOSIS — Z72820 Sleep deprivation: Secondary | ICD-10-CM

## 2015-07-30 DIAGNOSIS — R7989 Other specified abnormal findings of blood chemistry: Secondary | ICD-10-CM

## 2015-07-30 DIAGNOSIS — E538 Deficiency of other specified B group vitamins: Secondary | ICD-10-CM

## 2015-07-30 DIAGNOSIS — M25552 Pain in left hip: Secondary | ICD-10-CM

## 2015-07-30 DIAGNOSIS — R748 Abnormal levels of other serum enzymes: Secondary | ICD-10-CM

## 2015-07-30 DIAGNOSIS — F419 Anxiety disorder, unspecified: Secondary | ICD-10-CM

## 2015-07-30 MED ORDER — ALPRAZOLAM 0.25 MG PO TABS: ORAL_TABLET | ORAL | Status: DC

## 2015-07-30 NOTE — Progress Notes (Signed)
Subjective:    Patient ID: Christina Barnes, female    DOB: [birth year], 59 y.o.   MRN: [id number]  HPI Patient here today for low back pain and trouble sleeping. She is taking medications regularly. She states that she is experiencing more stress at work. Patient has seen the orthopedist and has had x-rays and has had her back pain evaluated. It is mostly on her left side. This happened after a fall in early June. The patient fell about 10 feet off of her patio and landed more on the left side. She saw the orthopedist and x-rays were done but everything was normal according to patient. She also says as indicated that she's been under a lot of stress at work and has to sit a lot. The combination of both the back pain and the stress at work have played a role in aggravating her insomnia. The patient denies caffeine intake after lunch every day.       Patient Active Problem List   Diagnosis Date Noted  . CAROTID BRUIT 12/02/2010  . HYPOTHYROIDISM 12/01/2010   Outpatient Encounter Prescriptions as of 07/30/2015  Medication Sig  . ELESTRIN 0.52 MG/0.87 GM (0.06%) GEL   . levothyroxine (SYNTHROID, LEVOTHROID) 112 MCG tablet Take 112 mcg by mouth daily before breakfast.  . omega-3 acid ethyl esters (LOVAZA) 1 G capsule Take 1 g by mouth.  . [DISCONTINUED] magnesium citrate SOLN Take 296 mLs (1 Bottle total) by mouth once.  . [DISCONTINUED] ondansetron (ZOFRAN ODT) 4 MG disintegrating tablet Take 1 tablet (4 mg total) by mouth every 8 (eight) hours as needed for nausea or vomiting.  . [DISCONTINUED] cyanocobalamin ((VITAMIN B-12)) injection 1,000 mcg    No facility-administered encounter medications on file as of 07/30/2015.     Review of Systems  Constitutional: Negative.   HENT: Negative.   Eyes: Negative.   Respiratory: Negative.   Cardiovascular: Negative.   Gastrointestinal: Negative.   Endocrine: Negative.   Genitourinary: Negative.   Musculoskeletal: Positive for back pain.    Skin: Negative.   Allergic/Immunologic: Negative.   Neurological: Negative.   Hematological: Negative.   Psychiatric/Behavioral: Negative.        Trouble sleeping       Objective:   Physical Exam  Constitutional: She is oriented to person, place, and time. She appears well-developed and well-nourished. No distress.  HENT:  Head: Normocephalic and atraumatic.  Eyes: Conjunctivae and EOM are normal. Pupils are equal, round, and reactive to light. Right eye exhibits no discharge. Left eye exhibits no discharge. No scleral icterus.  Neck: Normal range of motion. No thyromegaly present.  Musculoskeletal: Normal range of motion. She exhibits tenderness. She exhibits no edema.  There is tenderness in the posterior hip. Pain is reproducible with straight leg raising on the left. There is some pain reproduced with external rotation of the left hip. The pain is in the posterior hip.  Neurological: She is alert and oriented to person, place, and time. She has normal reflexes.  Skin: Skin is warm and dry. No rash noted.  Psychiatric: She has a normal mood and affect. Her behavior is normal. Judgment and thought content normal.  Nursing note and vitals reviewed.  BP 125/76 mmHg  Pulse 64  Temp(Src) 97.7 F (36.5 C) (Oral)  Ht 5' 1" (1.549 m)  Wt 127 lb (57.607 kg)  BMI 24.01 kg/m2        Assessment & Plan:  1. Anxiety -The anxiety and stresses associated with her job and  this is playing a role in causing insomnia. We will try some Xanax 1/2-1 at bedtime as needed to relax. - Vitamin B12 - BMP8+EGFR - CBC with Differential/Platelet - Magnesium  2. B12 deficiency -She has a remote history of B12 deficiency and we will check this today to see where we stand with this as far as her serum levels are concerned. - Vitamin B12 - BMP8+EGFR - CBC with Differential/Platelet - Magnesium  3. Sleep deficient -She has tried melatonin in the past and this did not work. We'll try Xanax because  of the job situation and stress is if this will help her relax and calm down and sleep better at nighttime - Vitamin B12 - BMP8+EGFR - CBC with Differential/Platelet - Magnesium  4. Left hip pain -She will take Aleve twice daily after breakfast and supper. If there is no relief with this she will call back in 3-4 weeks and we will arrange for An appointment with orthopedic specialist comes to this office.  Meds ordered this encounter  Medications  . ALPRAZolam (XANAX) 0.25 MG tablet    Sig: 1/2 to 1 whole tab QHS    Dispense:  30 tablet    Refill:  0   Patient Instructions  Try Aleve twice daily after breakfast and supper for at least 4 more weeks We will call you with the lab work results once those results become available. This will include a B12 level, CBC, kidney function test, magnesium level  Take the Xanax as needed at bedtime to see if this will help you relax If the hip pain is not better in 4 weeks please call back and make an appointment to see the orthopedist that comes to our office for possible injection   Arrie Senate MD

## 2015-07-30 NOTE — Patient Instructions (Signed)
Try Aleve twice daily after breakfast and supper for at least 4 more weeks We will call you with the lab work results once those results become available. This will include a B12 level, CBC, kidney function test, magnesium level  Take the Xanax as needed at bedtime to see if this will help you relax If the hip pain is not better in 4 weeks please call back and make an appointment to see the orthopedist that comes to our office for possible injection

## 2015-07-31 LAB — CBC WITH DIFFERENTIAL/PLATELET
BASOS ABS: 0 10*3/uL (ref 0.0–0.2)
Basos: 0 %
EOS (ABSOLUTE): 0.2 10*3/uL (ref 0.0–0.4)
EOS: 2 %
HEMATOCRIT: 43.8 % (ref 34.0–46.6)
Hemoglobin: 14.5 g/dL (ref 11.1–15.9)
Immature Grans (Abs): 0 10*3/uL (ref 0.0–0.1)
Immature Granulocytes: 0 %
LYMPHS ABS: 2.3 10*3/uL (ref 0.7–3.1)
Lymphs: 34 %
MCH: 28.7 pg (ref 26.6–33.0)
MCHC: 33.1 g/dL (ref 31.5–35.7)
MCV: 87 fL (ref 79–97)
Monocytes Absolute: 0.3 10*3/uL (ref 0.1–0.9)
Monocytes: 4 %
Neutrophils Absolute: 3.9 10*3/uL (ref 1.4–7.0)
Neutrophils: 60 %
Platelets: 208 10*3/uL (ref 150–379)
RBC: 5.06 x10E6/uL (ref 3.77–5.28)
RDW: 14.7 % (ref 12.3–15.4)
WBC: 6.6 10*3/uL (ref 3.4–10.8)

## 2015-07-31 LAB — BMP8+EGFR
BUN / CREAT RATIO: 21 (ref 9–23)
BUN: 23 mg/dL (ref 6–24)
CALCIUM: 9.9 mg/dL (ref 8.7–10.2)
CHLORIDE: 101 mmol/L (ref 97–108)
CO2: 21 mmol/L (ref 18–29)
Creatinine, Ser: 1.07 mg/dL — ABNORMAL HIGH (ref 0.57–1.00)
GFR calc Af Amer: 66 mL/min/{1.73_m2} (ref >59)
GFR calc non Af Amer: 57 mL/min/{1.73_m2} — ABNORMAL LOW (ref >59)
GLUCOSE: 90 mg/dL (ref 65–99)
Potassium: 4.7 mmol/L (ref 3.5–5.2)
Sodium: 141 mmol/L (ref 134–144)

## 2015-07-31 LAB — MAGNESIUM: Magnesium: 2.2 mg/dL (ref 1.6–2.3)

## 2015-07-31 LAB — VITAMIN B12: Vitamin B-12: 647 pg/mL (ref 211–946)

## 2015-08-02 NOTE — Addendum Note (Signed)
Addended by: Thana Ates on: 08/02/2015 04:10 PM   Modules accepted: Orders

## 2015-09-03 ENCOUNTER — Other Ambulatory Visit: Payer: BLUE CROSS/BLUE SHIELD

## 2015-09-03 ENCOUNTER — Other Ambulatory Visit: Payer: Self-pay | Admitting: Family Medicine

## 2015-09-03 DIAGNOSIS — R7989 Other specified abnormal findings of blood chemistry: Secondary | ICD-10-CM

## 2015-09-03 MED ORDER — ALPRAZOLAM 0.5 MG PO TBDP: ORAL_TABLET | ORAL | Status: DC

## 2015-09-03 NOTE — Progress Notes (Signed)
Lab only 

## 2015-09-03 NOTE — Telephone Encounter (Signed)
Patient says the alprazolam .5mg  works best for helping her sleep.   She would like a script sent to [facility].

## 2015-09-04 LAB — BMP8+EGFR
BUN / CREAT RATIO: 22 (ref 9–23)
BUN: 25 mg/dL — AB (ref 6–24)
CHLORIDE: 101 mmol/L (ref 97–106)
CO2: 23 mmol/L (ref 18–29)
CREATININE: 1.14 mg/dL — AB (ref 0.57–1.00)
Calcium: 9.5 mg/dL (ref 8.7–10.2)
GFR calc Af Amer: 61 mL/min/{1.73_m2} (ref >59)
GFR calc non Af Amer: 53 mL/min/{1.73_m2} — ABNORMAL LOW (ref >59)
GLUCOSE: 83 mg/dL (ref 65–99)
Potassium: 4.2 mmol/L (ref 3.5–5.2)
SODIUM: 140 mmol/L (ref 136–144)

## 2015-09-08 ENCOUNTER — Telehealth: Payer: Self-pay | Admitting: Family Medicine

## 2015-09-22 ENCOUNTER — Telehealth: Payer: Self-pay | Admitting: Family Medicine

## 2015-10-23 ENCOUNTER — Other Ambulatory Visit: Payer: Self-pay | Admitting: Family Medicine

## 2015-10-26 NOTE — Telephone Encounter (Signed)
Last filled 09/08/15, last seen 07/30/15. [facility]

## 2015-12-08 ENCOUNTER — Telehealth: Payer: Self-pay | Admitting: Family Medicine

## 2015-12-09 ENCOUNTER — Telehealth: Payer: Self-pay | Admitting: Family Medicine

## 2015-12-09 NOTE — Telephone Encounter (Signed)
Has refill, pt aware

## 2015-12-10 ENCOUNTER — Encounter: Payer: Self-pay | Admitting: *Deleted

## 2015-12-28 ENCOUNTER — Other Ambulatory Visit: Payer: Self-pay | Admitting: Obstetrics and Gynecology

## 2015-12-28 DIAGNOSIS — R928 Other abnormal and inconclusive findings on diagnostic imaging of breast: Secondary | ICD-10-CM

## 2016-01-04 ENCOUNTER — Other Ambulatory Visit: Payer: Managed Care, Other (non HMO)

## 2016-01-04 ENCOUNTER — Ambulatory Visit
Admission: RE | Admit: 2016-01-04 | Discharge: 2016-01-04 | Disposition: A | Discharge status: Home / Self Care | Payer: BLUE CROSS/BLUE SHIELD | Source: Ambulatory Visit | Attending: Obstetrics and Gynecology | Admitting: Obstetrics and Gynecology

## 2016-01-04 DIAGNOSIS — R928 Other abnormal and inconclusive findings on diagnostic imaging of breast: Secondary | ICD-10-CM

## 2016-01-17 ENCOUNTER — Other Ambulatory Visit: Payer: Self-pay | Admitting: Family Medicine

## 2016-01-17 NOTE — Telephone Encounter (Signed)
Last filled 12/09/15, last seen 07/30/15. [facility]

## 2016-02-18 ENCOUNTER — Other Ambulatory Visit: Payer: Self-pay | Admitting: Family Medicine

## 2016-02-18 NOTE — Telephone Encounter (Signed)
Last filled 01/17/2016. [facility] patient. If approved please route to pool to call into pharmacy

## 2016-02-18 NOTE — Telephone Encounter (Signed)
rx called into Pharmacy

## 2016-03-01 ENCOUNTER — Other Ambulatory Visit: Payer: Self-pay | Admitting: Family Medicine

## 2016-04-14 ENCOUNTER — Other Ambulatory Visit: Payer: Self-pay | Admitting: Family Medicine

## 2016-04-14 NOTE — Telephone Encounter (Signed)
Last seen 07/30/15  Dr Laurance Flatten  Dr Livia Snellen  PCP  If approved route to nurse to call into [facility]

## 2016-04-17 NOTE — Telephone Encounter (Signed)
rx called in and pt is aware.

## 2016-06-16 ENCOUNTER — Telehealth: Payer: Self-pay | Admitting: Family Medicine

## 2016-06-16 NOTE — Telephone Encounter (Signed)
Refused, pt aware. NTBS

## 2016-07-21 ENCOUNTER — Encounter: Payer: Self-pay | Admitting: Family Medicine

## 2016-07-21 ENCOUNTER — Ambulatory Visit (INDEPENDENT_AMBULATORY_CARE_PROVIDER_SITE_OTHER): Payer: BLUE CROSS/BLUE SHIELD | Admitting: Family Medicine

## 2016-07-21 VITALS — BP 95/66 | HR 64 | Temp 98.5°F | Ht 61.0 in | Wt 133.0 lb

## 2016-07-21 DIAGNOSIS — G4700 Insomnia, unspecified: Secondary | ICD-10-CM | POA: Diagnosis not present

## 2016-07-21 DIAGNOSIS — Z114 Encounter for screening for human immunodeficiency virus [HIV]: Secondary | ICD-10-CM | POA: Diagnosis not present

## 2016-07-21 DIAGNOSIS — Z131 Encounter for screening for diabetes mellitus: Secondary | ICD-10-CM

## 2016-07-21 DIAGNOSIS — Z1159 Encounter for screening for other viral diseases: Secondary | ICD-10-CM

## 2016-07-21 DIAGNOSIS — E039 Hypothyroidism, unspecified: Secondary | ICD-10-CM

## 2016-07-21 DIAGNOSIS — Z13220 Encounter for screening for lipoid disorders: Secondary | ICD-10-CM | POA: Diagnosis not present

## 2016-07-21 MED ORDER — TRAZODONE HCL 50 MG PO TABS: 25.0000 mg | ORAL_TABLET | Freq: Every evening | ORAL | 3 refills | Status: DC | PRN

## 2016-07-21 MED ORDER — ESTRADIOL-NORETHINDRONE ACET 0.05-0.14 MG/DAY TD PTTW: 1.0000 | MEDICATED_PATCH | TRANSDERMAL | 12 refills | Status: DC

## 2016-07-21 NOTE — Progress Notes (Signed)
BP 95/66   Pulse 64   Temp 98.5 F (36.9 C) (Oral)   Ht 5' 1"  (1.549 m)   Wt 133 lb (60.3 kg)   BMI 25.13 kg/m    Subjective:    Patient ID: Christina Barnes, female    DOB: Aug 03, 1956, 60 y.o.   MRN: [id number]  HPI: Christina Barnes is a 60 y.o. female presenting on 07/21/2016 for Insomnia (taking Xanax at bedtime but it does not keep her asleep all night, would like to discuss either increasing dosage or alternatives) and Repeat BMP (elevated creatinine at last labwork, needs to repeat)   HPI Insomnia In with complaints of insomnia. She has tried melatonin previously and she has also tried Ambien previously and did not like the way they made her feel. She currently is using Xanax on a small dose as needed for sleep and says it helps with sleep onset but does not keep her asleep and she would like something that helps or stay asleep through the night. She denies waking up because of anxiety or mind racing or gasping for air snoring. She denies any loud sounds she said she just wakes up and then can't go back to sleep in the middle the night. She typically gets about 5 hours and then wakes up and then has difficulty going back to sleep so than she is tired the next day at her work. She has been trying to make some changes of not using electronic devices in her bedroom.  Thyroid recheck  Patient is coming today for a thyroid recheck. She denies any issues with her thyroid and is happy with her medication. It is just been some time since she's had her labs rechecked and would like to get them checked today. She also has slightly elevated renal function on a previous test would like to get that rechecked as well.  Relevant past medical, surgical, family and social history reviewed and updated as indicated. Interim medical history since our last visit reviewed. Allergies and medications reviewed and updated.  Review of Systems  Constitutional: Negative for chills and fever.    HENT: Negative for congestion, ear discharge and ear pain.   Eyes: Negative for redness and visual disturbance.  Respiratory: Negative for chest tightness and shortness of breath.   Cardiovascular: Negative for chest pain and leg swelling.  Genitourinary: Negative for difficulty urinating and dysuria.  Musculoskeletal: Negative for back pain and gait problem.  Skin: Negative for rash.  Neurological: Negative for light-headedness and headaches.  Psychiatric/Behavioral: Positive for sleep disturbance. Negative for agitation, behavioral problems, decreased concentration, dysphoric mood, self-injury and suicidal ideas. The patient is not nervous/anxious.   All other systems reviewed and are negative.   Per HPI unless specifically indicated above     Medication List       Accurate as of 07/21/16  3:22 PM. Always use your most recent med list.          ELESTRIN 0.52 MG/0.87 GM (0.06%) Gel Generic drug:  Estradiol   estradiol-norethindrone 0.05-0.14 MG/DAY Commonly known as:  COMBIPATCH Place 1 patch onto the skin 2 (two) times a week. Start taking on:  07/24/2016   levothyroxine 112 MCG tablet Commonly known as:  SYNTHROID, LEVOTHROID Take 112 mcg by mouth daily before breakfast.   omega-3 acid ethyl esters 1 g capsule Commonly known as:  LOVAZA Take 1 g by mouth.   traZODone 50 MG tablet Commonly known as:  DESYREL Take 0.5-1 tablets (25-50 mg total)  by mouth at bedtime as needed for sleep.          Objective:    BP 95/66   Pulse 64   Temp 98.5 F (36.9 C) (Oral)   Ht 5' 1"  (1.549 m)   Wt 133 lb (60.3 kg)   BMI 25.13 kg/m   Wt Readings from Last 3 Encounters:  07/21/16 133 lb (60.3 kg)  07/30/15 127 lb (57.6 kg)  06/16/14 122 lb (55.3 kg)    Physical Exam  Constitutional: She is oriented to person, place, and time. She appears well-developed and well-nourished. No distress.  Eyes: Conjunctivae are normal.  Neck: Neck supple. No thyromegaly present.   Cardiovascular: Normal rate, regular rhythm, normal heart sounds and intact distal pulses.   No murmur heard. Pulmonary/Chest: Effort normal and breath sounds normal. No respiratory distress. She has no wheezes.  Musculoskeletal: Normal range of motion. She exhibits no edema or tenderness.  Lymphadenopathy:    She has no cervical adenopathy.  Neurological: She is alert and oriented to person, place, and time. Coordination normal.  Skin: Skin is warm and dry. No rash noted. She is not diaphoretic.  Psychiatric: She has a normal mood and affect. Her behavior is normal.  Nursing note and vitals reviewed.     Assessment & Plan:   Problem List Items Addressed This Visit      Endocrine   Thyroid activity decreased   Relevant Orders   TSH    Other Visit Diagnoses    Insomnia    -  Primary   Relevant Medications   traZODone (DESYREL) 50 MG tablet   Lipid screening       Relevant Orders   Lipid panel   Diabetes mellitus screening       Relevant Orders   CBC with Differential/Platelet   CMP14+EGFR   Need for hepatitis C screening test       Relevant Orders   Hepatitis C antibody   Screening for HIV without presence of risk factors       Relevant Orders   HIV antibody       Follow up plan: Return if symptoms worsen or fail to improve.  Counseling provided for all of the vaccine components Orders Placed This Encounter  Procedures  . Hepatitis C antibody  . HIV antibody  . CBC with Differential/Platelet  . CMP14+EGFR  . Lipid panel  . TSH    Caryl Pina, MD [facility] 07/21/2016, 3:22 PM

## 2016-07-22 LAB — CBC WITH DIFFERENTIAL/PLATELET
BASOS ABS: 0 10*3/uL (ref 0.0–0.2)
Basos: 1 %
EOS (ABSOLUTE): 0.2 10*3/uL (ref 0.0–0.4)
Eos: 3 %
HEMOGLOBIN: 14.4 g/dL (ref 11.1–15.9)
Hematocrit: 42.9 % (ref 34.0–46.6)
IMMATURE GRANS (ABS): 0 10*3/uL (ref 0.0–0.1)
Immature Granulocytes: 0 %
LYMPHS: 30 %
Lymphocytes Absolute: 2.1 10*3/uL (ref 0.7–3.1)
MCH: 29.6 pg (ref 26.6–33.0)
MCHC: 33.6 g/dL (ref 31.5–35.7)
MCV: 88 fL (ref 79–97)
MONOCYTES: 5 %
Monocytes Absolute: 0.3 10*3/uL (ref 0.1–0.9)
Neutrophils Absolute: 4.4 10*3/uL (ref 1.4–7.0)
Neutrophils: 61 %
PLATELETS: 212 10*3/uL (ref 150–379)
RBC: 4.87 x10E6/uL (ref 3.77–5.28)
RDW: 14.5 % (ref 12.3–15.4)
WBC: 7.1 10*3/uL (ref 3.4–10.8)

## 2016-07-22 LAB — HEPATITIS C ANTIBODY: Hep C Virus Ab: <0.1 s/co ratio (ref 0.0–0.9)

## 2016-07-22 LAB — CMP14+EGFR
ALBUMIN: 4.4 g/dL (ref 3.5–5.5)
ALK PHOS: 57 IU/L (ref 39–117)
ALT: 14 IU/L (ref 0–32)
AST: 14 IU/L (ref 0–40)
Albumin/Globulin Ratio: 1.8 (ref 1.2–2.2)
BUN / CREAT RATIO: 16 (ref 9–23)
BUN: 18 mg/dL (ref 6–24)
Bilirubin Total: 0.4 mg/dL (ref 0.0–1.2)
CHLORIDE: 102 mmol/L (ref 96–106)
CO2: 22 mmol/L (ref 18–29)
CREATININE: 1.14 mg/dL — AB (ref 0.57–1.00)
Calcium: 9.3 mg/dL (ref 8.7–10.2)
GFR calc Af Amer: 61 mL/min/{1.73_m2} (ref >59)
GFR calc non Af Amer: 53 mL/min/{1.73_m2} — ABNORMAL LOW (ref >59)
GLUCOSE: 88 mg/dL (ref 65–99)
Globulin, Total: 2.4 g/dL (ref 1.5–4.5)
Potassium: 4 mmol/L (ref 3.5–5.2)
Sodium: 140 mmol/L (ref 134–144)
TOTAL PROTEIN: 6.8 g/dL (ref 6.0–8.5)

## 2016-07-22 LAB — LIPID PANEL
CHOLESTEROL TOTAL: 240 mg/dL — AB (ref 100–199)
Chol/HDL Ratio: 3.1 ratio units (ref 0.0–4.4)
HDL: 77 mg/dL (ref >39)
LDL CALC: 125 mg/dL — AB (ref 0–99)
TRIGLYCERIDES: 190 mg/dL — AB (ref 0–149)
VLDL CHOLESTEROL CAL: 38 mg/dL (ref 5–40)

## 2016-07-22 LAB — HIV ANTIBODY (ROUTINE TESTING W REFLEX): HIV SCREEN 4TH GENERATION: NONREACTIVE

## 2016-07-22 LAB — TSH: TSH: 7.99 u[IU]/mL — ABNORMAL HIGH (ref 0.450–4.500)

## 2016-07-24 MED ORDER — LEVOTHYROXINE SODIUM 125 MCG PO TABS: 125.0000 ug | ORAL_TABLET | Freq: Every day | ORAL | 3 refills | Status: DC

## 2016-07-24 NOTE — Addendum Note (Signed)
Addended by: Thana Ates on: 07/24/2016 04:27 PM   Modules accepted: Orders

## 2016-08-14 DIAGNOSIS — E890 Postprocedural hypothyroidism: Secondary | ICD-10-CM | POA: Insufficient documentation

## 2016-09-12 ENCOUNTER — Telehealth: Payer: Self-pay | Admitting: Family Medicine

## 2016-10-12 NOTE — Telephone Encounter (Signed)
Detailed message left for patient that she needs to be seen to discuss.

## 2016-11-29 ENCOUNTER — Encounter: Payer: Self-pay | Admitting: Family Medicine

## 2016-11-29 ENCOUNTER — Ambulatory Visit (INDEPENDENT_AMBULATORY_CARE_PROVIDER_SITE_OTHER): Payer: BLUE CROSS/BLUE SHIELD | Admitting: Family Medicine

## 2016-11-29 VITALS — BP 114/54 | HR 76 | Temp 98.4°F | Ht 61.0 in | Wt 134.4 lb

## 2016-11-29 DIAGNOSIS — J0100 Acute maxillary sinusitis, unspecified: Secondary | ICD-10-CM | POA: Diagnosis not present

## 2016-11-29 MED ORDER — BENZONATATE 100 MG PO CAPS
100.0000 mg | ORAL_CAPSULE | Freq: Two times a day (BID) | ORAL | 0 refills | Status: DC | PRN
Start: 2016-11-29 — End: 2019-08-29

## 2016-11-29 MED ORDER — AZITHROMYCIN 250 MG PO TABS: ORAL_TABLET | ORAL | 0 refills | Status: DC

## 2016-11-29 MED ORDER — FLUTICASONE PROPIONATE 50 MCG/ACT NA SUSP
1.0000 | Freq: Two times a day (BID) | NASAL | 6 refills | Status: DC | PRN
Start: 2016-11-29 — End: 2019-09-25

## 2016-11-29 MED ORDER — PREDNISONE 20 MG PO TABS: ORAL_TABLET | ORAL | 0 refills | Status: DC

## 2016-11-29 NOTE — Progress Notes (Signed)
BP (!) 114/54   Pulse 76   Temp 98.4 F (36.9 C) (Oral)   Ht 5\' 1"  (1.549 m)   Wt 134 lb 6.4 oz (61 kg)   BMI 25.39 kg/m    Subjective:    Patient ID: Christina Barnes, female    DOB: Aug 08, 1956, 61 y.o.   MRN: [id number]  HPI: Christina Barnes is a 61 y.o. female presenting on 11/29/2016 for Cough (chest congestion for four days)   HPI Christina Barnes is a 61 year old female presenting with cough, sore throat, chest and nasal congestion and runny nose that has been worsening over the past 5 days.  She also reports having a fever around 100 degrees F last night.  Her cough has been fairly constant but at times worsens to coughing spells that cause her to get nauseas.  She says her cough is sometimes worse at night. She reports some drainage in her throat, sinus pain and pressure, and a sore throat that is starting to improve. She denies any SOB, diarrhea, or vomiting.  She has been hydrating well and has tried benadryl, Nyquil, Aspirin and trazodone to improve her symptoms and help with sleep, but none of have provided much relief.   Relevant past medical, surgical, family and social history reviewed and updated as indicated. Interim medical history since our last visit reviewed. Allergies and medications reviewed and updated.  Review of Systems  Constitutional: Positive for fatigue and fever.  HENT: Positive for congestion, postnasal drip, rhinorrhea, sinus pain and sinus pressure. Negative for ear discharge and ear pain.   Respiratory: Positive for cough. Negative for chest tightness and shortness of breath.   Cardiovascular: Negative for chest pain.  Gastrointestinal: Positive for nausea. Negative for abdominal pain, constipation, diarrhea and vomiting.  Genitourinary: Negative for difficulty urinating and dysuria.    Per HPI unless specifically indicated above     Objective:    BP (!) 114/54   Pulse 76   Temp 98.4 F (36.9 C) (Oral)   Ht 5\' 1"  (1.549 m)   Wt 134 lb 6.4  oz (61 kg)   BMI 25.39 kg/m   Wt Readings from Last 3 Encounters:  11/29/16 134 lb 6.4 oz (61 kg)  07/21/16 133 lb (60.3 kg)  07/30/15 127 lb (57.6 kg)    Physical Exam  Constitutional: She is oriented to person, place, and time. She appears well-developed and well-nourished. She is cooperative. No distress.  HENT:  Right Ear: Tympanic membrane, external ear and ear canal normal. No drainage. No middle ear effusion.  Left Ear: Tympanic membrane, external ear and ear canal normal. No drainage.  No middle ear effusion.  Nose: Right sinus exhibits maxillary sinus tenderness. Right sinus exhibits no frontal sinus tenderness. Left sinus exhibits maxillary sinus tenderness. Left sinus exhibits no frontal sinus tenderness.  Mouth/Throat: Uvula is midline. Posterior oropharyngeal erythema present. No oropharyngeal exudate.  Cardiovascular: Normal rate, regular rhythm and normal heart sounds.   Pulmonary/Chest: Effort normal and breath sounds normal. She has no wheezes. She has no rhonchi. She has no rales.  Lymphadenopathy:       Head (right side): No submental, no submandibular, no tonsillar, no preauricular, no posterior auricular and no occipital adenopathy present.       Head (left side): No submental, no submandibular, no tonsillar, no preauricular, no posterior auricular and no occipital adenopathy present.    She has no cervical adenopathy.  Neurological: She is alert and oriented to person, place, and time.  Assessment & Plan:   Problem List Items Addressed This Visit    None    Visit Diagnoses    Acute non-recurrent maxillary sinusitis    -  Primary   Relevant Medications   fluticasone (FLONASE) 50 MCG/ACT nasal spray   azithromycin (ZITHROMAX) 250 MG tablet   benzonatate (TESSALON) 100 MG capsule   predniSONE (DELTASONE) 20 MG tablet      Follow up plan: Return if symptoms worsen or fail to improve.  Counseling provided for all of the vaccine components No orders of  the defined types were placed in this encounter.   Patient was seen and examined with Arlana Lindau PA student. Agree with assessment and plan above.  Caryl Pina, MD [facility] 11/29/2016, 5:48 PM

## 2018-01-25 DIAGNOSIS — Z01419 Encounter for gynecological examination (general) (routine) without abnormal findings: Secondary | ICD-10-CM | POA: Diagnosis not present

## 2018-01-25 DIAGNOSIS — Z1231 Encounter for screening mammogram for malignant neoplasm of breast: Secondary | ICD-10-CM | POA: Diagnosis not present

## 2018-01-25 DIAGNOSIS — Z13820 Encounter for screening for osteoporosis: Secondary | ICD-10-CM | POA: Diagnosis not present

## 2018-01-25 DIAGNOSIS — Z6825 Body mass index (BMI) 25.0-25.9, adult: Secondary | ICD-10-CM | POA: Diagnosis not present

## 2018-04-24 DIAGNOSIS — N951 Menopausal and female climacteric states: Secondary | ICD-10-CM | POA: Diagnosis not present

## 2018-04-24 DIAGNOSIS — E559 Vitamin D deficiency, unspecified: Secondary | ICD-10-CM | POA: Diagnosis not present

## 2018-04-24 DIAGNOSIS — N959 Unspecified menopausal and perimenopausal disorder: Secondary | ICD-10-CM | POA: Diagnosis not present

## 2018-05-10 DIAGNOSIS — E559 Vitamin D deficiency, unspecified: Secondary | ICD-10-CM | POA: Diagnosis not present

## 2018-05-10 DIAGNOSIS — G4700 Insomnia, unspecified: Secondary | ICD-10-CM | POA: Diagnosis not present

## 2018-05-10 DIAGNOSIS — K146 Glossodynia: Secondary | ICD-10-CM | POA: Diagnosis not present

## 2018-05-10 DIAGNOSIS — N959 Unspecified menopausal and perimenopausal disorder: Secondary | ICD-10-CM | POA: Diagnosis not present

## 2018-05-29 DIAGNOSIS — H2513 Age-related nuclear cataract, bilateral: Secondary | ICD-10-CM | POA: Diagnosis not present

## 2018-07-30 DIAGNOSIS — E890 Postprocedural hypothyroidism: Secondary | ICD-10-CM | POA: Diagnosis not present

## 2018-08-07 DIAGNOSIS — H2513 Age-related nuclear cataract, bilateral: Secondary | ICD-10-CM | POA: Diagnosis not present

## 2018-08-07 DIAGNOSIS — H25043 Posterior subcapsular polar age-related cataract, bilateral: Secondary | ICD-10-CM | POA: Diagnosis not present

## 2018-08-07 DIAGNOSIS — H25013 Cortical age-related cataract, bilateral: Secondary | ICD-10-CM | POA: Diagnosis not present

## 2018-08-27 DIAGNOSIS — H2512 Age-related nuclear cataract, left eye: Secondary | ICD-10-CM | POA: Diagnosis not present

## 2018-08-27 DIAGNOSIS — H25811 Combined forms of age-related cataract, right eye: Secondary | ICD-10-CM | POA: Diagnosis not present

## 2018-08-27 DIAGNOSIS — H2511 Age-related nuclear cataract, right eye: Secondary | ICD-10-CM | POA: Diagnosis not present

## 2018-08-28 DIAGNOSIS — H2512 Age-related nuclear cataract, left eye: Secondary | ICD-10-CM | POA: Diagnosis not present

## 2018-09-03 DIAGNOSIS — H25812 Combined forms of age-related cataract, left eye: Secondary | ICD-10-CM | POA: Diagnosis not present

## 2018-09-03 DIAGNOSIS — H2512 Age-related nuclear cataract, left eye: Secondary | ICD-10-CM | POA: Diagnosis not present

## 2018-09-16 ENCOUNTER — Ambulatory Visit: Payer: BLUE CROSS/BLUE SHIELD

## 2018-10-02 DIAGNOSIS — Z961 Presence of intraocular lens: Secondary | ICD-10-CM | POA: Diagnosis not present

## 2018-10-02 DIAGNOSIS — H26491 Other secondary cataract, right eye: Secondary | ICD-10-CM | POA: Diagnosis not present

## 2018-10-14 DIAGNOSIS — H26492 Other secondary cataract, left eye: Secondary | ICD-10-CM | POA: Diagnosis not present

## 2018-10-18 DIAGNOSIS — Z961 Presence of intraocular lens: Secondary | ICD-10-CM | POA: Diagnosis not present

## 2019-05-05 ENCOUNTER — Telehealth: Payer: Self-pay | Admitting: General Practice

## 2019-05-05 NOTE — Telephone Encounter (Signed)
Spoke with pt regarding sxs Pt will call back if sxs persist or worsen

## 2019-05-12 DIAGNOSIS — E559 Vitamin D deficiency, unspecified: Secondary | ICD-10-CM | POA: Diagnosis not present

## 2019-05-12 DIAGNOSIS — N959 Unspecified menopausal and perimenopausal disorder: Secondary | ICD-10-CM | POA: Diagnosis not present

## 2019-05-12 DIAGNOSIS — N951 Menopausal and female climacteric states: Secondary | ICD-10-CM | POA: Diagnosis not present

## 2019-05-14 DIAGNOSIS — Z961 Presence of intraocular lens: Secondary | ICD-10-CM | POA: Diagnosis not present

## 2019-05-23 DIAGNOSIS — E039 Hypothyroidism, unspecified: Secondary | ICD-10-CM | POA: Diagnosis not present

## 2019-05-23 DIAGNOSIS — N959 Unspecified menopausal and perimenopausal disorder: Secondary | ICD-10-CM | POA: Diagnosis not present

## 2019-05-23 DIAGNOSIS — K146 Glossodynia: Secondary | ICD-10-CM | POA: Diagnosis not present

## 2019-05-23 DIAGNOSIS — N951 Menopausal and female climacteric states: Secondary | ICD-10-CM | POA: Diagnosis not present

## 2019-05-23 DIAGNOSIS — G4700 Insomnia, unspecified: Secondary | ICD-10-CM | POA: Diagnosis not present

## 2019-05-30 DIAGNOSIS — D2230 Melanocytic nevi of unspecified part of face: Secondary | ICD-10-CM | POA: Diagnosis not present

## 2019-05-30 DIAGNOSIS — L821 Other seborrheic keratosis: Secondary | ICD-10-CM | POA: Diagnosis not present

## 2019-05-30 DIAGNOSIS — D1801 Hemangioma of skin and subcutaneous tissue: Secondary | ICD-10-CM | POA: Diagnosis not present

## 2019-05-30 DIAGNOSIS — L281 Prurigo nodularis: Secondary | ICD-10-CM | POA: Diagnosis not present

## 2019-08-29 ENCOUNTER — Other Ambulatory Visit: Payer: Self-pay

## 2019-08-29 ENCOUNTER — Ambulatory Visit (INDEPENDENT_AMBULATORY_CARE_PROVIDER_SITE_OTHER): Payer: BLUE CROSS/BLUE SHIELD | Admitting: Family

## 2019-08-29 ENCOUNTER — Encounter: Payer: Self-pay | Admitting: Family

## 2019-08-29 DIAGNOSIS — R059 Cough, unspecified: Secondary | ICD-10-CM

## 2019-08-29 DIAGNOSIS — R05 Cough: Secondary | ICD-10-CM | POA: Diagnosis not present

## 2019-08-29 DIAGNOSIS — Z20822 Contact with and (suspected) exposure to covid-19: Secondary | ICD-10-CM

## 2019-08-29 MED ORDER — BENZONATATE 200 MG PO CAPS: 200.0000 mg | ORAL_CAPSULE | Freq: Three times a day (TID) | ORAL | 1 refills | Status: DC | PRN

## 2019-08-29 MED ORDER — ALBUTEROL SULFATE HFA 108 (90 BASE) MCG/ACT IN AERS: 2.0000 | INHALATION_SPRAY | Freq: Four times a day (QID) | RESPIRATORY_TRACT | 0 refills | Status: DC | PRN

## 2019-08-29 MED ORDER — PREDNISONE 10 MG (21) PO TBPK: ORAL_TABLET | ORAL | 0 refills | Status: DC

## 2019-08-29 NOTE — Progress Notes (Signed)
Virtual Visit via telephone Note Due to COVID-19 pandemic this visit was conducted virtually. This visit type was conducted due to national recommendations for restrictions regarding the COVID-19 Pandemic (e.g. social distancing, sheltering in place) in an effort to limit this patient's exposure and mitigate transmission in our community. All issues noted in this document were discussed and addressed.  A physical exam was not performed with this format.  I connected with Christina Barnes on 08/29/19 at 10:20 AM by telephone and verified that I am speaking with the correct person using two identifiers. Christina [place] is currently located at home and no one is currently with her during visit. The provider, Evelina Dun, FNP is located in their office at time of visit.  I discussed the limitations, risks, security and privacy concerns of performing an evaluation and management service by telephone and the availability of in person appointments. I also discussed with the patient that there may be a patient responsible charge related to this service. The patient expressed understanding and agreed to proceed.   History and Present Illness:  Cough This is a new problem. The current episode started in the past 7 days. The problem has been rapidly worsening. The problem occurs every few minutes. The cough is non-productive. Associated symptoms include a fever, myalgias, nasal congestion, a sore throat, shortness of breath (Intermittent when coughing) and wheezing. Pertinent negatives include no chills, ear congestion, ear pain or headaches. The symptoms are aggravated by lying down. She has tried rest and prescription cough suppressant for the symptoms. The treatment provided no relief.      Review of Systems  Constitutional: Positive for fever. Negative for chills.  HENT: Positive for sore throat. Negative for ear pain.   Respiratory: Positive for cough, shortness of breath (Intermittent  when coughing) and wheezing.   Musculoskeletal: Positive for myalgias.  Neurological: Negative for headaches.  All other systems reviewed and are negative.    Observations/Objective: No SOB or distress noted, hoarse voice, and intermittent dry nonproductive cough  Assessment and Plan: 1. Cough - Take meds as prescribed - Use a cool mist humidifier  -Use saline nose sprays frequently -Force fluids -For any cough or congestion  Use plain Mucinex- regular strength or max strength is fine -For fever or aces or pains- take tylenol or ibuprofen. -Throat lozenges if help -Call office if symptoms worsen or do not improve  - predniSONE (STERAPRED UNI-PAK 21 TAB) 10 MG (21) TBPK tablet; Use as directed  Dispense: 21 tablet; Refill: 0 - benzonatate (TESSALON) 200 MG capsule; Take 1 capsule (200 mg total) by mouth 3 (three) times daily as needed.  Dispense: 30 capsule; Refill: 1 - albuterol (VENTOLIN HFA) 108 (90 Base) MCG/ACT inhaler; Inhale 2 puffs into the lungs every 6 (six) hours as needed for wheezing or shortness of breath.  Dispense: 8 g; Refill: 0     I discussed the assessment and treatment plan with the patient. The patient was provided an opportunity to ask questions and all were answered. The patient agreed with the plan and demonstrated an understanding of the instructions.   The patient was advised to call back or seek an in-person evaluation if the symptoms worsen or if the condition fails to improve as anticipated.  The above assessment and management plan was discussed with the patient. The patient verbalized understanding of and has agreed to the management plan. Patient is aware to call the clinic if symptoms persist or worsen. Patient is aware when to return  to the clinic for a follow-up visit. Patient educated on when it is appropriate to go to the emergency department.   Time call ended:  10:35 AM  I provided 15 minutes of non-face-to-face time during this encounter.     Evelina Dun, FNP

## 2019-08-30 LAB — NOVEL CORONAVIRUS, NAA: SARS-CoV-2, NAA: NOT DETECTED

## 2019-09-24 ENCOUNTER — Other Ambulatory Visit: Payer: Self-pay

## 2019-09-25 ENCOUNTER — Encounter: Payer: Self-pay | Admitting: Family Medicine

## 2019-09-25 ENCOUNTER — Ambulatory Visit: Payer: BLUE CROSS/BLUE SHIELD | Admitting: Family Medicine

## 2019-09-25 VITALS — BP 130/79 | HR 80 | Temp 99.4°F | Ht 61.0 in | Wt 152.0 lb

## 2019-09-25 DIAGNOSIS — L989 Disorder of the skin and subcutaneous tissue, unspecified: Secondary | ICD-10-CM

## 2019-09-25 DIAGNOSIS — M79644 Pain in right finger(s): Secondary | ICD-10-CM

## 2019-09-25 DIAGNOSIS — Z23 Encounter for immunization: Secondary | ICD-10-CM

## 2019-09-25 DIAGNOSIS — G479 Sleep disorder, unspecified: Secondary | ICD-10-CM

## 2019-09-25 MED ORDER — MIRTAZAPINE 15 MG PO TABS: 15.0000 mg | ORAL_TABLET | Freq: Every day | ORAL | 0 refills | Status: DC

## 2019-09-25 MED ORDER — SHINGRIX 50 MCG/0.5ML IM SUSR: 0.5000 mL | Freq: Once | INTRAMUSCULAR | 0 refills | Status: AC

## 2019-09-25 MED ORDER — OMEGA-3-ACID ETHYL ESTERS 1 G PO CAPS
1.0000 g | ORAL_CAPSULE | Freq: Two times a day (BID) | ORAL | 2 refills | Status: DC
Start: 2019-09-25 — End: 2020-08-04

## 2019-09-25 NOTE — Patient Instructions (Addendum)
Voltaren Gel - four times a day.    Follow these instructions at home: Eating and drinking   Follow advice from your doctor about what to eat and drink. Your doctor may tell you to: ? Limit fast food, sweets, and processed snack foods. ? Choose low-fat options. For example, choose low-fat milk instead of whole milk. ? Eat 5 or more servings of fruits or vegetables each day. ? Eat at home more often. This gives you more control over what you eat. ? Choose healthy foods when you eat out. ? Learn to read food labels. This will help you learn how much food is in 1 serving. ? Keep low-fat snacks available. ? Avoid drinks that have a lot of sugar in them. These include soda, fruit juice, iced tea with sugar, and flavored milk.  Drink enough water to keep your pee (urine) pale yellow.  Do not go on fad diets. Physical activity  Exercise often, as told by your doctor. Most adults should get up to 150 minutes of moderate-intensity exercise every week.Ask your doctor: ? What types of exercise are safe for you. ? How often you should exercise.  Warm up and stretch before being active.  Do slow stretching after being active (cool down).  Rest between times of being active. Lifestyle  Work with your doctor and a food expert (dietitian) to set a weight-loss goal that is best for you.  Limit your screen time.  Find ways to reward yourself that do not involve food.  Do not drink alcohol if: ? Your doctor tells you not to drink. ? You are pregnant, may be pregnant, or are planning to become pregnant.  If you drink alcohol: ? Limit how much you use to:  0-1 drink a day for women.  0-2 drinks a day for men. ? Be aware of how much alcohol is in your drink. In the [place] one drink equals one 12 oz bottle of beer (355 mL), one 5 oz glass of wine (148 mL), or one 1 oz glass of hard liquor (44 mL). General instructions  Keep a weight-loss journal. This can help you keep track of: ? The  food that you eat. ? How much exercise you get.  Take over-the-counter and prescription medicines only as told by your doctor.  Take vitamins and supplements only as told by your doctor.  Think about joining a support group.  Keep all follow-up visits as told by your doctor. This is important. Contact a doctor if:  You cannot meet your weight loss goal after you have changed your diet and lifestyle for 6 weeks. Get help right away if you:  Are having trouble breathing.  Are having thoughts of harming yourself. Summary  Obesity is having too much body fat.  Being obese means that your weight is more than what is healthy for you.  Work with your doctor to set a weight-loss goal.  Get regular exercise as told by your doctor. This information is not intended to replace advice given to you by your health care provider. Make sure you discuss any questions you have with your health care provider. Document Released: 01/01/2012 Document Revised: 06/13/2018 Document Reviewed: 06/13/2018 Elsevier Patient Education  2020 [employer].

## 2019-09-25 NOTE — Progress Notes (Signed)
New Patient Office Visit  Assessment & Plan:  1. Difficulty sleeping - D/C'd Xanax as it is not indicated for sleep.  She has already been off of it for a month so there is no need to wean.  When patient was made aware that I would not be refilling the Xanax since it was not indicated for sleep she stated that she would like to change her answer as to why she takes the Xanax.  She then changed her answer to stress.  I also explained that Xanax is not indicated for long-term management of stress and anxiety.  We will try mirtazapine to tackle sleep. - mirtazapine (REMERON) 15 MG tablet; Take 1 tablet (15 mg total) by mouth at bedtime.  Dispense: 30 tablet; Refill: 0  2. 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  Encouraged use of Voltaren gel 4 times daily as needed.  3. Skin lesion of left leg - No treatment, reassurance provided.  4. Need for immunization against influenza - Flu Vaccine QUAD 36+ mos IM  5. Immunization due - SHINGRIX injection; Inject 0.5 mLs into the muscle once for 1 dose.  Dispense: 0.5 mL; Refill: 0 - Tdap vaccine greater than or equal to 7yo IM  Labs completed with endocrinologist.  Follow-up: Return in about 4 weeks (around 10/23/2019) for sleep (telephone).   Hendricks Limes, MSN, APRN, FNP-C [facility]  Subjective:  Patient ID: Christina Barnes, female    DOB: 1956/09/06  Age: 63 y.o. MRN: [id number]  Patient Care Team: Loman Brooklyn, FNP as PCP - General (Family Medicine) Amalia Greenhouse, MD as Consulting Physician (Endocrinology) Everlene Farrier, MD as Consulting Physician (Obstetrics and Gynecology)  CC:  Chief Complaint  Patient presents with  . Medication Refill    HPI Christina Barnes presents for medication refills.   Brenas prescribes progesterone and estradiol.  Dr. Posey Pronto, endocrinologist, prescribes levothyroxine and folic acid.  Patient is here  specifically for a refill of her Xanax 0.5 mg that she takes at bedtime.  According to the review of her PDMP Dr. Gaetano Net has been the prescriber of this medication.  When asked why she takes this medication she said to help her sleep.  She has been out of Xanax for the past month.  Alternative medications that she has tried in the past to help her sleep are melatonin, Restoril, and trazodone.  She reports the trazodone made her feel bad and the others just did not work for her.  Depression screen [facility] 2/9 09/25/2019 07/21/2016 07/30/2015  Decreased Interest 0 0 0  Down, Depressed, Hopeless 0 0 0  PHQ - 2 Score 0 0 0  Altered sleeping 1 - -  Tired, decreased energy 1 - -  Change in appetite 0 - -  Feeling bad or failure about yourself  0 - -  Trouble concentrating 0 - -  Moving slowly or fidgety/restless 0 - -  Suicidal thoughts 0 - -  PHQ-9 Score 2 - -  Difficult doing work/chores Not difficult at all - -   GAD 7 : Generalized Anxiety Score 09/25/2019  Nervous, Anxious, on Edge 2  Control/stop worrying 0  Worry too much - different things 0  Trouble relaxing 2  Restless 1  Easily annoyed or irritable 0  Afraid - awful might happen 0  Total GAD 7 Score 5  Anxiety Difficulty Somewhat difficult    Patient also concerned about pain in her  right index finger.  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  It does not bother her a terribly lot but thought she would bring it up and see if there was anything simple that could be done.  Also has a area on her left calf that she would like looked at.  She just noticed it within the past week.  Denies any itching or pain.  But does feel some slight pressure.   Review of Systems  Constitutional: Negative for chills, fever, malaise/fatigue and weight loss.  HENT: Negative for congestion, ear discharge, ear pain, nosebleeds, sinus pain, sore throat and tinnitus.   Eyes: Negative for blurred  vision, double vision, pain, discharge and redness.  Respiratory: Negative for cough, shortness of breath and wheezing.   Cardiovascular: Negative for chest pain, palpitations and leg swelling.  Gastrointestinal: Negative for abdominal pain, constipation, diarrhea, heartburn, nausea and vomiting.  Genitourinary: Negative for dysuria, frequency and urgency.  Musculoskeletal: Positive for joint pain. Negative for myalgias.  Skin: Negative for rash.  Neurological: Negative for dizziness, seizures, weakness and headaches.  Psychiatric/Behavioral: Negative for depression, substance abuse and suicidal ideas. The patient is not nervous/anxious.     Current Outpatient Medications:  .  estradiol (VIVELLE-DOT) 0.05 MG/24HR patch, 1 patch 2 (two) times a week. , Disp: , Rfl:  .  folic acid (FOLVITE) 1 MG tablet, Take 1 mg by mouth daily., Disp: , Rfl:  .  levothyroxine (SYNTHROID, LEVOTHROID) 125 MCG tablet, Take 1 tablet (125 mcg total) by mouth daily., Disp: 90 tablet, Rfl: 3 .  omega-3 acid ethyl esters (LOVAZA) 1 g capsule, Take 1 capsule (1 g total) by mouth 2 (two) times daily., Disp: 180 capsule, Rfl: 2 .  Progesterone Micronized 10 % CREA, Place onto the skin., Disp: , Rfl:  .  mirtazapine (REMERON) 15 MG tablet, Take 1 tablet (15 mg total) by mouth at bedtime., Disp: 30 tablet, Rfl: 0  Allergies  Allergen Reactions  . Augmentin [Amoxicillin-Pot Clavulanate]   . Doxycycline   . Erythromycin   . Penicillins     Past Medical History:  Diagnosis Date  . Thyroid disease     Past Surgical History:  Procedure Laterality Date  . SPINE SURGERY     neck  . UTERINE FIBROID SURGERY      Family History  Problem Relation Age of Onset  . Kidney disease Mother   . Heart disease Mother   . Heart disease Maternal Grandmother     Social History   Socioeconomic History  . Marital status: Married    Spouse name: Not on file  . Number of children: Not on file  . Years of education: Not on  file  . Highest education level: Not on file  Occupational History  . Not on file  Social Needs  . Financial resource strain: Not on file  . Food insecurity    Worry: Not on file    Inability: Not on file  . Transportation needs    Medical: Not on file    Non-medical: Not on file  Tobacco Use  . Smoking status: Never Smoker  . Smokeless tobacco: Never Used  Substance and Sexual Activity  . Alcohol use: No  . Drug use: No  . Sexual activity: Yes  Lifestyle  . Physical activity    Days per week: Not on file    Minutes per session: Not on file  .  Stress: Not on file  Relationships  . Social Herbalist on phone: Not on file    Gets together: Not on file    Attends religious service: Not on file    Active member of club or organization: Not on file    Attends meetings of clubs or organizations: Not on file    Relationship status: Not on file  . Intimate partner violence    Fear of current or ex partner: Not on file    Emotionally abused: Not on file    Physically abused: Not on file    Forced sexual activity: Not on file  Other Topics Concern  . Not on file  Social History Narrative  . Not on file    Objective:   Today's Vitals: BP 130/79   Pulse 80   Temp 99.4 F (37.4 C) (Temporal)   Ht 5\' 1"  (1.549 m)   Wt 152 lb (68.9 kg)   SpO2 99%   BMI 28.72 kg/m   Physical Exam Vitals signs reviewed.  Constitutional:      General: She is not in acute distress.    Appearance: Normal appearance. She is overweight. She is not ill-appearing, toxic-appearing or diaphoretic.  HENT:     Head: Normocephalic and atraumatic.  Eyes:     General: No scleral icterus.       Right eye: No discharge.        Left eye: No discharge.     Conjunctiva/sclera: Conjunctivae normal.  Neck:     Musculoskeletal: Normal range of motion.  Cardiovascular:     Rate and Rhythm: Normal rate and regular rhythm.     Heart sounds: Normal heart sounds. No murmur. No friction rub. No  gallop.   Pulmonary:     Effort: Pulmonary effort is normal. No respiratory distress.     Breath sounds: Normal breath sounds. No stridor. No wheezing, rhonchi or rales.  Musculoskeletal: Normal range of motion.     Comments: No deformity or lesions to right index finger. Pain within the PIP joint of the index finger.   Skin:    General: Skin is warm and dry.     Capillary Refill: Capillary refill takes less than 2 seconds.     Findings: Lesion (single papular lesion on the left calf; no surround erythema, warmth, or drainage. ) present.  Neurological:     General: No focal deficit present.     Mental Status: She is alert and oriented to person, place, and time. Mental status is at baseline.  Psychiatric:        Mood and Affect: Mood normal.        Behavior: Behavior normal.        Thought Content: Thought content normal.        Judgment: Judgment normal.

## 2019-09-26 DIAGNOSIS — Z23 Encounter for immunization: Secondary | ICD-10-CM | POA: Diagnosis not present

## 2019-10-14 DIAGNOSIS — M79641 Pain in right hand: Secondary | ICD-10-CM | POA: Diagnosis not present

## 2019-10-31 DIAGNOSIS — E890 Postprocedural hypothyroidism: Secondary | ICD-10-CM | POA: Diagnosis not present

## 2019-10-31 DIAGNOSIS — E782 Mixed hyperlipidemia: Secondary | ICD-10-CM | POA: Diagnosis not present

## 2019-11-14 DIAGNOSIS — S300XXD Contusion of lower back and pelvis, subsequent encounter: Secondary | ICD-10-CM | POA: Diagnosis not present

## 2019-11-17 DIAGNOSIS — H02413 Mechanical ptosis of bilateral eyelids: Secondary | ICD-10-CM | POA: Diagnosis not present

## 2019-11-17 DIAGNOSIS — H02834 Dermatochalasis of left upper eyelid: Secondary | ICD-10-CM | POA: Diagnosis not present

## 2019-11-17 DIAGNOSIS — H02423 Myogenic ptosis of bilateral eyelids: Secondary | ICD-10-CM | POA: Diagnosis not present

## 2019-11-17 DIAGNOSIS — H02831 Dermatochalasis of right upper eyelid: Secondary | ICD-10-CM | POA: Diagnosis not present

## 2019-12-01 ENCOUNTER — Telehealth: Payer: Self-pay | Admitting: *Deleted

## 2019-12-01 NOTE — Telephone Encounter (Signed)
Received prior auth on [employer]. Per [place] patient can switch to over the counter fish oil.

## 2019-12-04 NOTE — Telephone Encounter (Signed)
Multiple attempts were made to contact pt without return call, will close encounter.

## 2019-12-31 ENCOUNTER — Encounter: Payer: Self-pay | Admitting: Family Medicine

## 2020-01-14 DIAGNOSIS — Z23 Encounter for immunization: Secondary | ICD-10-CM | POA: Diagnosis not present

## 2020-02-06 DIAGNOSIS — K146 Glossodynia: Secondary | ICD-10-CM | POA: Diagnosis not present

## 2020-02-06 DIAGNOSIS — Z01419 Encounter for gynecological examination (general) (routine) without abnormal findings: Secondary | ICD-10-CM | POA: Diagnosis not present

## 2020-02-06 DIAGNOSIS — N959 Unspecified menopausal and perimenopausal disorder: Secondary | ICD-10-CM | POA: Diagnosis not present

## 2020-02-06 DIAGNOSIS — Z1231 Encounter for screening mammogram for malignant neoplasm of breast: Secondary | ICD-10-CM | POA: Diagnosis not present

## 2020-02-06 DIAGNOSIS — M8580 Other specified disorders of bone density and structure, unspecified site: Secondary | ICD-10-CM | POA: Diagnosis not present

## 2020-02-06 DIAGNOSIS — D519 Vitamin B12 deficiency anemia, unspecified: Secondary | ICD-10-CM | POA: Diagnosis not present

## 2020-02-06 DIAGNOSIS — Z6828 Body mass index (BMI) 28.0-28.9, adult: Secondary | ICD-10-CM | POA: Diagnosis not present

## 2020-02-09 ENCOUNTER — Other Ambulatory Visit: Payer: Self-pay | Admitting: Obstetrics and Gynecology

## 2020-02-09 DIAGNOSIS — R928 Other abnormal and inconclusive findings on diagnostic imaging of breast: Secondary | ICD-10-CM

## 2020-02-11 DIAGNOSIS — Z23 Encounter for immunization: Secondary | ICD-10-CM | POA: Diagnosis not present

## 2020-02-19 ENCOUNTER — Other Ambulatory Visit: Payer: BLUE CROSS/BLUE SHIELD

## 2020-02-23 ENCOUNTER — Other Ambulatory Visit: Payer: BLUE CROSS/BLUE SHIELD

## 2020-03-16 ENCOUNTER — Other Ambulatory Visit: Payer: Self-pay

## 2020-03-16 ENCOUNTER — Ambulatory Visit
Admission: RE | Admit: 2020-03-16 | Discharge: 2020-03-16 | Disposition: A | Discharge status: Home / Self Care | Payer: BLUE CROSS/BLUE SHIELD | Source: Ambulatory Visit | Attending: Obstetrics and Gynecology | Admitting: Obstetrics and Gynecology

## 2020-03-16 ENCOUNTER — Ambulatory Visit
Admission: RE | Admit: 2020-03-16 | Discharge: 2020-03-16 | Disposition: A | Discharge status: Home / Self Care | Payer: BC Managed Care – PPO | Source: Ambulatory Visit | Attending: Obstetrics and Gynecology | Admitting: Obstetrics and Gynecology

## 2020-03-16 DIAGNOSIS — R928 Other abnormal and inconclusive findings on diagnostic imaging of breast: Secondary | ICD-10-CM

## 2020-03-16 DIAGNOSIS — R921 Mammographic calcification found on diagnostic imaging of breast: Secondary | ICD-10-CM | POA: Diagnosis not present

## 2020-03-16 DIAGNOSIS — N6012 Diffuse cystic mastopathy of left breast: Secondary | ICD-10-CM | POA: Diagnosis not present

## 2020-03-23 ENCOUNTER — Other Ambulatory Visit: Payer: BLUE CROSS/BLUE SHIELD

## 2020-04-02 DIAGNOSIS — L821 Other seborrheic keratosis: Secondary | ICD-10-CM | POA: Diagnosis not present

## 2020-04-02 DIAGNOSIS — N870 Mild cervical dysplasia: Secondary | ICD-10-CM | POA: Diagnosis not present

## 2020-04-02 DIAGNOSIS — D225 Melanocytic nevi of trunk: Secondary | ICD-10-CM | POA: Diagnosis not present

## 2020-04-02 DIAGNOSIS — N879 Dysplasia of cervix uteri, unspecified: Secondary | ICD-10-CM | POA: Diagnosis not present

## 2020-04-02 DIAGNOSIS — D239 Other benign neoplasm of skin, unspecified: Secondary | ICD-10-CM | POA: Diagnosis not present

## 2020-04-02 DIAGNOSIS — R87612 Low grade squamous intraepithelial lesion on cytologic smear of cervix (LGSIL): Secondary | ICD-10-CM | POA: Diagnosis not present

## 2020-05-03 ENCOUNTER — Ambulatory Visit: Payer: BC Managed Care – PPO | Admitting: Dermatology

## 2020-06-16 ENCOUNTER — Telehealth: Payer: Self-pay | Admitting: Family Medicine

## 2020-06-16 NOTE — Telephone Encounter (Signed)
Pt states that she is covid positive and wanting to know how to go about getting regeron. Please advise.

## 2020-06-17 NOTE — Telephone Encounter (Signed)
I do not know what regeron is?

## 2020-06-17 NOTE — Telephone Encounter (Signed)
Patient aware and verbalizes understanding.  States she is COVID NEG.

## 2020-06-17 NOTE — Telephone Encounter (Signed)
Patient can call the COVID-19 antibody infusion clinic to see if she is a candidate at [phone].

## 2020-07-22 ENCOUNTER — Telehealth: Payer: Self-pay

## 2020-07-23 NOTE — Telephone Encounter (Signed)
Yes have seen her in the past and I will take her on the patient

## 2020-07-23 NOTE — Telephone Encounter (Signed)
Spoke with patient, appointment scheduled 08/22/20 with Dr. Warrick Parisian to re-establish care

## 2020-07-26 ENCOUNTER — Ambulatory Visit: Payer: BLUE CROSS/BLUE SHIELD

## 2020-08-04 ENCOUNTER — Ambulatory Visit (INDEPENDENT_AMBULATORY_CARE_PROVIDER_SITE_OTHER): Payer: BC Managed Care – PPO | Admitting: Family Medicine

## 2020-08-04 ENCOUNTER — Other Ambulatory Visit: Payer: Self-pay

## 2020-08-04 ENCOUNTER — Encounter: Payer: Self-pay | Admitting: Family Medicine

## 2020-08-04 VITALS — BP 132/70 | HR 64 | Temp 97.0°F | Ht 61.0 in | Wt 146.0 lb

## 2020-08-04 DIAGNOSIS — Z23 Encounter for immunization: Secondary | ICD-10-CM | POA: Diagnosis not present

## 2020-08-04 DIAGNOSIS — E1169 Type 2 diabetes mellitus with other specified complication: Secondary | ICD-10-CM

## 2020-08-04 DIAGNOSIS — G4700 Insomnia, unspecified: Secondary | ICD-10-CM

## 2020-08-04 DIAGNOSIS — E785 Hyperlipidemia, unspecified: Secondary | ICD-10-CM | POA: Diagnosis not present

## 2020-08-04 MED ORDER — TRAZODONE HCL 50 MG PO TABS: 25.0000 mg | ORAL_TABLET | Freq: Every evening | ORAL | 3 refills | Status: DC | PRN

## 2020-08-04 MED ORDER — OMEGA-3-ACID ETHYL ESTERS 1 G PO CAPS: 2.0000 g | ORAL_CAPSULE | Freq: Two times a day (BID) | ORAL | 3 refills | Status: AC

## 2020-08-04 NOTE — Progress Notes (Signed)
BP 132/70   Pulse 64   Temp (!) 97 F (36.1 C)   Ht 5' 1" (1.549 m)   Wt 146 lb (66.2 kg)   SpO2 98%   BMI 27.59 kg/m    Subjective:   Patient ID: Christina Barnes, female    DOB: 20-Jan-1956, 64 y.o.   MRN: [id number]  HPI: Christina Barnes is a 64 y.o. female presenting on 08/04/2020 for Medical Management of Chronic Issues   HPI Insomnia  Patient is coming in complaining of difficulty sleeping especially at night.  She says this has been gradually worsening.  She has tried some over-the-counter agents including melatonin and does not seem to have success with it.  She would like to try something to help.  She is able to fall asleep sometimes but then she wakes up and then she is not able to stay asleep but she does have trouble falling asleep sometimes as well.  Relevant past medical, surgical, family and social history reviewed and updated as indicated. Interim medical history since our last visit reviewed. Allergies and medications reviewed and updated.  Review of Systems  Constitutional: Negative for chills and fever.  Eyes: Negative for visual disturbance.  Respiratory: Negative for chest tightness and shortness of breath.   Cardiovascular: Negative for chest pain and leg swelling.  Genitourinary: Negative for difficulty urinating and dysuria.  Musculoskeletal: Negative for back pain and gait problem.  Skin: Negative for rash.  Neurological: Negative for light-headedness and headaches.  Psychiatric/Behavioral: Negative for agitation and behavioral problems.  All other systems reviewed and are negative.   Per HPI unless specifically indicated above   Allergies as of 08/04/2020      Reactions   Augmentin [amoxicillin-pot Clavulanate]    Doxycycline    Erythromycin    Penicillins       Medication List       Accurate as of August 04, 2020  4:55 PM. If you have any questions, ask your nurse or doctor.        STOP taking these medications    mirtazapine 15 MG tablet Commonly known as: Remeron Stopped by: Fransisca Kaufmann Addeline Calarco, MD     TAKE these medications   estradiol 0.05 MG/24HR patch Commonly known as: VIVELLE-DOT 1 patch 2 (two) times a week.   folic acid 1 MG tablet Commonly known as: FOLVITE Take 1 mg by mouth daily.   levothyroxine 125 MCG tablet Commonly known as: SYNTHROID Take 1 tablet (125 mcg total) by mouth daily.   omega-3 acid ethyl esters 1 g capsule Commonly known as: LOVAZA Take 2 capsules (2 g total) by mouth 2 (two) times daily. What changed: how much to take Changed by: Fransisca Kaufmann Ronne Stefanski, MD   Progesterone Micronized 10 % Crea Place onto the skin.   traZODone 50 MG tablet Commonly known as: DESYREL Take 0.5-1 tablets (25-50 mg total) by mouth at bedtime as needed for sleep. Started by: Fransisca Kaufmann Harper Smoker, MD        Objective:   BP 132/70   Pulse 64   Temp (!) 97 F (36.1 C)   Ht 5' 1" (1.549 m)   Wt 146 lb (66.2 kg)   SpO2 98%   BMI 27.59 kg/m   Wt Readings from Last 3 Encounters:  08/04/20 146 lb (66.2 kg)  09/25/19 152 lb (68.9 kg)  11/29/16 134 lb 6.4 oz (61 kg)    Physical Exam Vitals and nursing note reviewed.  Constitutional:  General: She is not in acute distress.    Appearance: She is well-developed. She is not diaphoretic.  Eyes:     Conjunctiva/sclera: Conjunctivae normal.  Cardiovascular:     Rate and Rhythm: Normal rate and regular rhythm.     Heart sounds: Normal heart sounds. No murmur heard.   Pulmonary:     Effort: Pulmonary effort is normal. No respiratory distress.     Breath sounds: Normal breath sounds. No wheezing.  Skin:    General: Skin is warm and dry.     Findings: No rash.  Neurological:     Mental Status: She is alert and oriented to person, place, and time.     Coordination: Coordination normal.  Psychiatric:        Behavior: Behavior normal.        Thought Content: Thought content does not include suicidal ideation. Thought  content does not include suicidal plan.        Cognition and Memory: Memory is impaired.       Assessment & Plan:   Problem List Items Addressed This Visit    None    Visit Diagnoses    Insomnia, unspecified type    -  Primary   Relevant Medications   traZODone (DESYREL) 50 MG tablet   Other Relevant Orders   CBC with Differential/Platelet (Completed)   CMP14+EGFR (Completed)   Flu vaccine need       Relevant Orders   Flu Vaccine QUAD 6+ mos PF IM (Fluarix Quad PF) (Completed)   Hyperlipidemia associated with type 2 diabetes mellitus (HCC)       Relevant Medications   omega-3 acid ethyl esters (LOVAZA) 1 g capsule   Other Relevant Orders   Lipid panel (Completed)       Will start trazodone and see how it goes for insomnia. Follow up plan: Return in about 3 months (around 11/04/2020), or if symptoms worsen or fail to improve, for Recheck insomnia.  Counseling provided for all of the vaccine components Orders Placed This Encounter  Procedures  . Flu Vaccine QUAD 6+ mos PF IM (Fluarix Quad PF)  . CBC with Differential/Platelet  . CMP14+EGFR  . Lipid panel    Caryl Pina, MD [facility] 08/04/2020, 4:55 PM

## 2020-08-05 LAB — CMP14+EGFR
ALT: 17 IU/L (ref 0–32)
AST: 17 IU/L (ref 0–40)
Albumin/Globulin Ratio: 2 (ref 1.2–2.2)
Albumin: 4.3 g/dL (ref 3.8–4.8)
Alkaline Phosphatase: 72 IU/L (ref 44–121)
BUN/Creatinine Ratio: 16 (ref 12–28)
BUN: 18 mg/dL (ref 8–27)
Bilirubin Total: 0.4 mg/dL (ref 0.0–1.2)
CO2: 25 mmol/L (ref 20–29)
Calcium: 9.1 mg/dL (ref 8.7–10.3)
Chloride: 104 mmol/L (ref 96–106)
Creatinine, Ser: 1.1 mg/dL — ABNORMAL HIGH (ref 0.57–1.00)
GFR calc Af Amer: 62 mL/min/{1.73_m2} (ref >59)
GFR calc non Af Amer: 54 mL/min/{1.73_m2} — ABNORMAL LOW (ref >59)
Globulin, Total: 2.1 g/dL (ref 1.5–4.5)
Glucose: 93 mg/dL (ref 65–99)
Potassium: 4.2 mmol/L (ref 3.5–5.2)
Sodium: 140 mmol/L (ref 134–144)
Total Protein: 6.4 g/dL (ref 6.0–8.5)

## 2020-08-05 LAB — CBC WITH DIFFERENTIAL/PLATELET
Basophils Absolute: 0.1 10*3/uL (ref 0.0–0.2)
Basos: 1 %
EOS (ABSOLUTE): 0.2 10*3/uL (ref 0.0–0.4)
Eos: 3 %
Hematocrit: 42.5 % (ref 34.0–46.6)
Hemoglobin: 13.8 g/dL (ref 11.1–15.9)
Immature Grans (Abs): 0 10*3/uL (ref 0.0–0.1)
Immature Granulocytes: 0 %
Lymphocytes Absolute: 2.3 10*3/uL (ref 0.7–3.1)
Lymphs: 34 %
MCH: 28.5 pg (ref 26.6–33.0)
MCHC: 32.5 g/dL (ref 31.5–35.7)
MCV: 88 fL (ref 79–97)
Monocytes Absolute: 0.3 10*3/uL (ref 0.1–0.9)
Monocytes: 5 %
Neutrophils Absolute: 3.9 10*3/uL (ref 1.4–7.0)
Neutrophils: 57 %
Platelets: 228 10*3/uL (ref 150–450)
RBC: 4.84 x10E6/uL (ref 3.77–5.28)
RDW: 13.4 % (ref 11.7–15.4)
WBC: 6.9 10*3/uL (ref 3.4–10.8)

## 2020-08-05 LAB — LIPID PANEL
Chol/HDL Ratio: 3.3 ratio (ref 0.0–4.4)
Cholesterol, Total: 216 mg/dL — ABNORMAL HIGH (ref 100–199)
HDL: 66 mg/dL (ref >39)
LDL Chol Calc (NIH): 130 mg/dL — ABNORMAL HIGH (ref 0–99)
Triglycerides: 115 mg/dL (ref 0–149)
VLDL Cholesterol Cal: 20 mg/dL (ref 5–40)

## 2020-08-12 ENCOUNTER — Telehealth: Payer: Self-pay

## 2020-08-12 MED ORDER — PRAVASTATIN SODIUM 20 MG PO TABS: 20.0000 mg | ORAL_TABLET | Freq: Every day | ORAL | 1 refills | Status: DC

## 2020-08-12 NOTE — Telephone Encounter (Signed)
Attempted to contact - NA 

## 2020-08-12 NOTE — Telephone Encounter (Signed)
Pt wants lab results. She says that she did not have a miss call. Please call back

## 2020-08-12 NOTE — Addendum Note (Signed)
Addended by: Karle Plumber on: 08/12/2020 04:27 PM   Modules accepted: Orders

## 2020-10-13 ENCOUNTER — Telehealth: Payer: BC Managed Care – PPO | Admitting: Family

## 2020-10-13 DIAGNOSIS — J209 Acute bronchitis, unspecified: Secondary | ICD-10-CM

## 2020-10-13 MED ORDER — PREDNISONE 10 MG (21) PO TBPK: ORAL_TABLET | ORAL | 0 refills | Status: DC

## 2020-10-13 MED ORDER — BENZONATATE 100 MG PO CAPS: 100.0000 mg | ORAL_CAPSULE | Freq: Three times a day (TID) | ORAL | 0 refills | Status: DC | PRN

## 2020-10-13 NOTE — Progress Notes (Signed)

## 2020-11-04 ENCOUNTER — Ambulatory Visit: Payer: BC Managed Care – PPO | Admitting: Family Medicine

## 2020-11-19 ENCOUNTER — Ambulatory Visit: Payer: BC Managed Care – PPO | Admitting: Family Medicine

## 2020-12-06 ENCOUNTER — Ambulatory Visit: Payer: BC Managed Care – PPO | Admitting: Dermatology

## 2020-12-08 ENCOUNTER — Encounter: Payer: Self-pay | Admitting: Family Medicine

## 2020-12-08 ENCOUNTER — Ambulatory Visit: Payer: BC Managed Care – PPO | Admitting: Family Medicine

## 2020-12-08 ENCOUNTER — Other Ambulatory Visit: Payer: Self-pay

## 2020-12-08 VITALS — BP 129/71 | HR 73 | Temp 98.6°F | Resp 20 | Ht 61.0 in | Wt 147.0 lb

## 2020-12-08 DIAGNOSIS — Z01818 Encounter for other preprocedural examination: Secondary | ICD-10-CM

## 2020-12-08 DIAGNOSIS — E782 Mixed hyperlipidemia: Secondary | ICD-10-CM

## 2020-12-08 DIAGNOSIS — E890 Postprocedural hypothyroidism: Secondary | ICD-10-CM

## 2020-12-08 MED ORDER — DOXEPIN HCL 10 MG PO CAPS: 10.0000 mg | ORAL_CAPSULE | Freq: Every evening | ORAL | 2 refills | Status: DC | PRN

## 2020-12-08 MED ORDER — LEVOTHYROXINE SODIUM 125 MCG PO TABS: 125.0000 ug | ORAL_TABLET | Freq: Every day | ORAL | 3 refills | Status: DC

## 2020-12-08 NOTE — Progress Notes (Signed)
BP 129/71   Pulse 73   Temp 98.6 F (37 C) (Temporal)   Resp 20   Ht 5' 1"  (1.549 m)   Wt 147 lb (66.7 kg)   SpO2 96%   BMI 27.78 kg/m    Subjective:   Patient ID: Christina Barnes, female    DOB: 1956-01-09, 65 y.o.   MRN: [id number]  HPI: Christina Barnes is a 65 y.o. female presenting on 12/08/2020 for Medical Management of Chronic Issues   HPI Insomnia Patient still complaining of insomnia, she tried trazodone but said it woke her up with heart palpitations so she would like to try some different.  She says her father-in-law has temazepam but we are trying to avoid that so we discussed other options with her.  She is also tried melatonin but did not go as well with that either.  She was not able to sleep well with that.  Patient is coming in for preoperative physical today and blood work and checkup.  She is going to have a plastic surgery on her eyelids.  Patient needs blood work and EKG faxed to her surgeon.  Hypothyroidism recheck Patient is coming in for thyroid recheck today as well. They deny any issues with [place] changes or heat or cold problems or diarrhea or constipation. They deny any chest pain or palpitations. They are currently on levothyroxine 130mcrograms   Hyperlipidemia Patient is coming in for recheck of his hyperlipidemia. The patient is currently taking fish oil, never took the pravastatin and is trying diet and exercise. They deny any issues with myalgias or history of liver damage from it. They deny any focal numbness or weakness or chest pain.   Relevant past medical, surgical, family and social history reviewed and updated as indicated. Interim medical history since our last visit reviewed. Allergies and medications reviewed and updated.  Review of Systems  Constitutional: Negative for chills and fever.  HENT: Negative for ear discharge.   Eyes: Negative for visual disturbance.  Respiratory: Negative for chest tightness and shortness of  breath.   Cardiovascular: Negative for chest pain and leg swelling.  Musculoskeletal: Negative for back pain and gait problem.  Skin: Negative for rash.  Neurological: Negative for dizziness, light-headedness and headaches.  Psychiatric/Behavioral: Positive for sleep disturbance. Negative for agitation, behavioral problems, self-injury and suicidal ideas. The patient is nervous/anxious.   All other systems reviewed and are negative.   Per HPI unless specifically indicated above   Allergies as of 12/08/2020      Reactions   Augmentin [amoxicillin-pot Clavulanate]    Doxycycline    Erythromycin    Penicillins       Medication List       Accurate as of December 08, 2020 11:09 AM. If you have any questions, ask your nurse or doctor.        STOP taking these medications   benzonatate 100 MG capsule Commonly known as: TBest boyStopped by: JFransisca KaufmannDettinger, MD   predniSONE 10 MG (21) Tbpk tablet Commonly known as: STERAPRED UNI-PAK 21 TAB Stopped by: JWorthy Rancher MD   traZODone 50 MG tablet Commonly known as: DESYREL Stopped by: JWorthy Rancher MD     TAKE these medications   estradiol 0.05 MG/24HR patch Commonly known as: VIVELLE-DOT 1 patch 2 (two) times a week.   folic acid 1 MG tablet Commonly known as: FOLVITE Take 1 mg by mouth daily.   levothyroxine 125 MCG tablet Commonly known as: SYNTHROID Take  1 tablet (125 mcg total) by mouth daily.   omega-3 acid ethyl esters 1 g capsule Commonly known as: LOVAZA Take 2 capsules (2 g total) by mouth 2 (two) times daily.   pravastatin 20 MG tablet Commonly known as: PRAVACHOL Take 1 tablet (20 mg total) by mouth daily after supper.   Progesterone Micronized 10 % Crea Place onto the skin.        Objective:   BP 129/71   Pulse 73   Temp 98.6 F (37 C) (Temporal)   Resp 20   Ht 5' 1"  (1.549 m)   Wt 147 lb (66.7 kg)   SpO2 96%   BMI 27.78 kg/m   Wt Readings from Last 3 Encounters:   12/08/20 147 lb (66.7 kg)  08/04/20 146 lb (66.2 kg)  09/25/19 152 lb (68.9 kg)    Physical Exam Vitals and nursing note reviewed.  Constitutional:      General: She is not in acute distress.    Appearance: She is well-developed and well-nourished. She is not diaphoretic.  Eyes:     Extraocular Movements: EOM normal.     Conjunctiva/sclera: Conjunctivae normal.  Cardiovascular:     Rate and Rhythm: Normal rate and regular rhythm.     Pulses: Intact distal pulses.     Heart sounds: Normal heart sounds. No murmur heard.   Pulmonary:     Effort: Pulmonary effort is normal. No respiratory distress.     Breath sounds: Normal breath sounds. No wheezing.  Musculoskeletal:        General: No tenderness or edema. Normal range of motion.  Skin:    General: Skin is warm and dry.     Findings: No rash.  Neurological:     Mental Status: She is alert and oriented to person, place, and time.     Coordination: Coordination normal.  Psychiatric:        Mood and Affect: Mood and affect normal.        Behavior: Behavior normal.     EKG: Normal sinus rhythm  Assessment & Plan:   Problem List Items Addressed This Visit      Endocrine   Postablative hypothyroidism - Primary   Relevant Medications   levothyroxine (SYNTHROID) 125 MCG tablet     Other   Hyperlipidemia   Relevant Orders   Lipid panel    Other Visit Diagnoses    Preoperative clearance       Relevant Orders   EKG 12-Lead   CBC with Differential/Platelet   CMP14+EGFR      Will try doxepin for sleep, will do blood work in his lungs blood work looks good we will send to her Psychologist, sport and exercise.  No other change medication pending normal blood work Follow up plan: Return in about 3 months (around 03/07/2021), or if symptoms worsen or fail to improve, for Thyroid and cholesterol recheck.  Counseling provided for all of the vaccine components Orders Placed This Encounter  Procedures  . EKG 12-Lead    Caryl Pina,  MD [facility] 12/08/2020, 11:09 AM

## 2020-12-09 LAB — CBC WITH DIFFERENTIAL/PLATELET
Basophils Absolute: 0.1 10*3/uL (ref 0.0–0.2)
Basos: 1 %
EOS (ABSOLUTE): 0.2 10*3/uL (ref 0.0–0.4)
Eos: 3 %
Hematocrit: 44.1 % (ref 34.0–46.6)
Hemoglobin: 15.3 g/dL (ref 11.1–15.9)
Immature Grans (Abs): 0 10*3/uL (ref 0.0–0.1)
Immature Granulocytes: 0 %
Lymphocytes Absolute: 1.9 10*3/uL (ref 0.7–3.1)
Lymphs: 29 %
MCH: 30.3 pg (ref 26.6–33.0)
MCHC: 34.7 g/dL (ref 31.5–35.7)
MCV: 87 fL (ref 79–97)
Monocytes Absolute: 0.4 10*3/uL (ref 0.1–0.9)
Monocytes: 6 %
Neutrophils Absolute: 4 10*3/uL (ref 1.4–7.0)
Neutrophils: 61 %
Platelets: 231 10*3/uL (ref 150–450)
RBC: 5.05 x10E6/uL (ref 3.77–5.28)
RDW: 13.6 % (ref 11.7–15.4)
WBC: 6.6 10*3/uL (ref 3.4–10.8)

## 2020-12-09 LAB — CMP14+EGFR
ALT: 18 IU/L (ref 0–32)
AST: 20 IU/L (ref 0–40)
Albumin/Globulin Ratio: 1.9 (ref 1.2–2.2)
Albumin: 4.5 g/dL (ref 3.8–4.8)
Alkaline Phosphatase: 65 IU/L (ref 44–121)
BUN/Creatinine Ratio: 17 (ref 12–28)
BUN: 19 mg/dL (ref 8–27)
Bilirubin Total: 0.6 mg/dL (ref 0.0–1.2)
CO2: 22 mmol/L (ref 20–29)
Calcium: 9.7 mg/dL (ref 8.7–10.3)
Chloride: 104 mmol/L (ref 96–106)
Creatinine, Ser: 1.13 mg/dL — ABNORMAL HIGH (ref 0.57–1.00)
GFR calc Af Amer: 60 mL/min/{1.73_m2} (ref >59)
GFR calc non Af Amer: 52 mL/min/{1.73_m2} — ABNORMAL LOW (ref >59)
Globulin, Total: 2.4 g/dL (ref 1.5–4.5)
Glucose: 87 mg/dL (ref 65–99)
Potassium: 4.8 mmol/L (ref 3.5–5.2)
Sodium: 140 mmol/L (ref 134–144)
Total Protein: 6.9 g/dL (ref 6.0–8.5)

## 2020-12-09 LAB — LIPID PANEL
Chol/HDL Ratio: 3.3 ratio (ref 0.0–4.4)
Cholesterol, Total: 227 mg/dL — ABNORMAL HIGH (ref 100–199)
HDL: 69 mg/dL (ref >39)
LDL Chol Calc (NIH): 131 mg/dL — ABNORMAL HIGH (ref 0–99)
Triglycerides: 152 mg/dL — ABNORMAL HIGH (ref 0–149)
VLDL Cholesterol Cal: 27 mg/dL (ref 5–40)

## 2020-12-09 LAB — TSH: TSH: 1.5 u[IU]/mL (ref 0.450–4.500)

## 2021-01-04 ENCOUNTER — Encounter: Payer: Self-pay | Admitting: Family Medicine

## 2021-01-04 DIAGNOSIS — G4700 Insomnia, unspecified: Secondary | ICD-10-CM

## 2021-01-06 MED ORDER — BELSOMRA 10 MG PO TABS: 10.0000 mg | ORAL_TABLET | Freq: Every day | ORAL | 2 refills | Status: DC

## 2021-01-07 ENCOUNTER — Telehealth: Payer: Self-pay | Admitting: *Deleted

## 2021-01-07 DIAGNOSIS — G4700 Insomnia, unspecified: Secondary | ICD-10-CM

## 2021-01-07 NOTE — Telephone Encounter (Signed)
Belsomra 10MG  tablet Pa started  Key: [id number] Sent to plan

## 2021-01-10 NOTE — Telephone Encounter (Signed)
Approvedon March 19 Effective from 01/07/2021 through 01/06/2022. Pharmacy aware.

## 2021-02-14 DIAGNOSIS — E890 Postprocedural hypothyroidism: Secondary | ICD-10-CM | POA: Diagnosis not present

## 2021-02-14 DIAGNOSIS — H0520 Unspecified exophthalmos: Secondary | ICD-10-CM | POA: Diagnosis not present

## 2021-02-14 DIAGNOSIS — Z411 Encounter for cosmetic surgery: Secondary | ICD-10-CM | POA: Diagnosis not present

## 2021-02-14 DIAGNOSIS — E782 Mixed hyperlipidemia: Secondary | ICD-10-CM | POA: Diagnosis not present

## 2021-02-18 ENCOUNTER — Other Ambulatory Visit: Payer: Self-pay

## 2021-02-18 ENCOUNTER — Encounter: Payer: Self-pay | Admitting: Nurse Practitioner

## 2021-02-18 ENCOUNTER — Ambulatory Visit: Payer: BC Managed Care – PPO | Admitting: Nurse Practitioner

## 2021-02-18 VITALS — BP 121/71 | HR 73 | Temp 97.8°F | Ht 61.0 in | Wt 143.0 lb

## 2021-02-18 DIAGNOSIS — Z9889 Other specified postprocedural states: Secondary | ICD-10-CM

## 2021-02-18 DIAGNOSIS — H10013 Acute follicular conjunctivitis, bilateral: Secondary | ICD-10-CM | POA: Diagnosis not present

## 2021-02-18 NOTE — Assessment & Plan Note (Addendum)
New symptoms of swelling, discomfort and feelings of gitty pebble in right eye.  Patient is 5 weeks S/P blepharoplasty.  Advised patient to go to [facility] for evaluation.  Completed referral to ophthalmology STAT.  Follow-up with eye surgeon.  Use cold compress on clean washcloth, Tylenol/ibuprofen for pain.  Follow-up with worsening unresolved symptoms

## 2021-02-18 NOTE — Patient Instructions (Signed)
Blepharoplasty  Blepharoplasty is a type of eyelid surgery that is performed to remove loose and droopy skin from around the eyes. Loose and droopy skin around the eyes is common in older people. This is because the skin becomes less stretchy (elastic) as a person ages. During this procedure, puffy bags above and below the eyes can also be removed. Puffiness may be caused by fat deposits or loose muscles around the eyes. You may have blepharoplasty on your upper eyelids, lower eyelids, or both. This procedure is performed for two reasons:  To improve your appearance.  To improve vision, if sagging upper eyelids begin to interfere with your ability to see. Tell a health care provider about:  Any allergies you have.  All medicines you are taking, including vitamins, herbs, eye drops, creams, and over-the-counter medicines.  Any problems you or family members have had with anesthetic medicines.  Any blood disorders you have.  Any surgeries you have had.  Any medical conditions you have.  Whether you are pregnant or may be pregnant. What are the risks? Generally, this is a safe procedure. However, problems may occur, including:  Bleeding.  Infection.  Dryness of the eyes.  Trouble closing your eyes.  Eyelids that roll outward.  Scarring.  The need for more surgery.  Changes in your vision. What happens before the procedure? Staying hydrated Follow instructions from your health care provider about hydration, which may include:  Up to 2 hours before the procedure - you may continue to drink clear liquids, such as water, clear fruit juice, black coffee, and plain tea. Eating and drinking restrictions Follow instructions from your health care provider about eating and drinking, which may include:  8 hours before the procedure - stop eating heavy meals or foods such as meat, fried foods, or fatty foods.  6 hours before the procedure - stop eating light meals or foods, such  as toast or cereal.  6 hours before the procedure - stop drinking milk or drinks that contain milk.  2 hours before the procedure - stop drinking clear liquids. Medicines Ask your health care provider about:  Changing or stopping your regular medicines. This is especially important if you are taking diabetes medicines or blood thinners.  Taking medicines such as aspirin and ibuprofen. These medicines can thin your blood. Do not take these medicines unless your health care provider tells you to take them.  Taking over-the-counter medicines, vitamins, herbs, and supplements. General instructions  Do not drink alcohol.  Do not use any products that contain nicotine or tobacco, such as cigarettes and e-cigarettes. If you need help quitting, ask your health care provider.  Ask your health care provider how your surgical site will be marked or identified.  Plan to have someone take you home from the hospital or clinic.  Plan to have a responsible adult care for you for at least 24 hours after you leave the hospital or clinic. This is important. What happens during the procedure?  To lower your risk of infection: ? Your health care team will wash or sanitize their hands. ? Your skin will be washed with soap.  An IV will be inserted into one of your veins.  Your surgeon may mark your eyelids to indicate where the incisions should be made.  You will be given one or more of the following: ? A medicine to help you relax (sedative). ? A medicine to numb the area (local anesthetic). ? A medicine to make you fall asleep (general  anesthetic).  Your surgeon will make incisions in the natural creases in the skin that is above or below your eyes. ? If you are having upper and lower blepharoplasty, incisions will be made above and below your eyes. ? If you are having lower lid blepharoplasty that does not require skin removal, an incision may be made just inside your lower eyelids  (transconjunctival incision).  Any fat deposits or extra skin will be removed. Loose muscle tissue may be trimmed or tightened.  Your surgeon will close the incisions with very small stitches (sutures), a type of surgical glue, or tiny adhesive strips. Transconjunctival incisions will be closed with sutures that dissolve as your body heals (absorbable sutures).  Eye drops may be placed in your eye, and ointment may be put over your incisions. The procedure may vary among health care providers and hospitals. What happens after the procedure?  Your blood pressure, heart rate, breathing rate, and blood oxygen level will be monitored until the medicines you were given have worn off.  You may have to use eye drops or ointment after your procedure.  Do not drive for 24 hours if you were given a sedative during your procedure. Summary  Blepharoplasty is a type of eyelid surgery that is performed to remove loose and droopy skin from around the eyes.  You will go home on the same day as the procedure.  Serious complications are very rare after this procedure. This information is not intended to replace advice given to you by your health care provider. Make sure you discuss any questions you have with your health care provider. Document Revised: 05/13/2020 Document Reviewed: 05/13/2020 Elsevier Patient Education  2021 [place] [employer] This sheet gives you information about how to care for yourself after your procedure. Your health care provider may also give you more specific instructions. If you have problems or questions, contact your health care provider. What can I expect after the procedure? After the procedure, it is common to have:  Swelling.  Bruising.  Soreness.  Sticky, dry, and itchy eyes. Follow these instructions at home: Medicines  Take over-the-counter and prescription medicines only as told by your health care provider.  If you were prescribed  an antibiotic medicine, take or apply it as told by your health care provider. Do not stop using the antibiotic even if you start to feel better.  Use eye drops or ointment as told by your health care provider.   Incision care  Do not soak or wash your face until your health care provider says that you can. Follow instructions from your health care provider about bathing.  Follow instructions from your health care provider about how to take care of your incision. ? Make sure you leave stitches (sutures), skin glue, or adhesive strips in place. These skin closures may need to stay in place for 2 weeks or longer. ? If adhesive strip edges start to loosen and curl up, you may trim the loose edges. Do not remove adhesive strips completely unless your health care provider tells you to do that.  Check your incision area every day for signs of infection. Check for: ? More redness, swelling, or pain. ? Fluid or blood. ? Warmth. ? Pus or a bad smell.  If directed, put ice on the eye area to help reduce swelling and soreness: ? Put ice in a plastic bag. ? Place a towel between your skin and the bag. ? Leave the ice on for 20 minutes,  2-3 times a day.   Activity  Use a few pillows to keep your head raised (elevated) while you are sleeping or resting.  Do not bend over. Bending over causes your head to be lower than your heart and may increase swelling and bruising around your eyes.  Until your health care provider approves: ? Do not do any activities that take a lot of effort. ? Do not lift anything that is heavier than 10 lb (4.5 kg). General instructions  Do not use any products that contain nicotine or tobacco, such as cigarettes and e-cigarettes. If you need help quitting, ask your health care provider.  To protect your eyes from the sun, wear dark sunglasses and a wide-brimmed hat. Do this until healing is complete. This helps to keep the suture areas from becoming discolored.  Keep all  follow-up visits as told by your health care provider. This is important. Contact a health care provider if:  You have a fever.  You have dryness, pain, swelling, or bruising that is not getting better.  You have more redness, swelling, or pain around your incision.  You have fluid or blood coming from your incision.  Your incision feels warm to the touch.  You have pus or a bad smell coming from your incision.  You cannot close your eyes completely. Get help right away if:  Your eyeball is bulging or your eyeball position is different from normal.  You have double vision or blurry vision that is not getting better.  You have any change in your vision.  You have severe eye pain. Summary  After this procedure, it is common to have swelling and bruising around the eyes.  Follow instructions from your health care provider about how to take care of yourself at home.  Get help right away if you have double vision, or if your eyeball is bulging or its position is different from normal. These are emergency symptoms. This information is not intended to replace advice given to you by your health care provider. Make sure you discuss any questions you have with your health care provider. Document Revised: 05/13/2020 Document Reviewed: 05/13/2020 Elsevier Patient Education  [place].

## 2021-02-18 NOTE — Progress Notes (Addendum)
Acute Office Visit  Subjective:    Patient ID: Christina Barnes, female    DOB: Aug 03, 1956, 65 y.o.   MRN: [id number]  Chief Complaint  Patient presents with  . Eye Problem    Eye Problem  The right eye is affected. This is a new problem. The current episode started in the past 7 days. The problem occurs constantly. The problem has been gradually worsening. Injury mechanism: Surgery 5 weeks ago. Pain severity now: Discomfort. There is no known exposure to pink eye. She does not wear contacts. Associated symptoms include a foreign body sensation. Pertinent negatives include no blurred vision, eye discharge, double vision, eye redness, fever or photophobia. She has tried nothing for the symptoms.     Past Medical History:  Diagnosis Date  . Thyroid disease     Past Surgical History:  Procedure Laterality Date  . SPINE SURGERY     neck  . UTERINE FIBROID SURGERY      Family History  Problem Relation Age of Onset  . Kidney disease Mother   . Heart disease Mother   . Heart disease Maternal Grandmother     Social History   Socioeconomic History  . Marital status: Married    Spouse name: Not on file  . Number of children: Not on file  . Years of education: Not on file  . Highest education level: Not on file  Occupational History  . Not on file  Tobacco Use  . Smoking status: Never Smoker  . Smokeless tobacco: Never Used  Vaping Use  . Vaping Use: Never used  Substance and Sexual Activity  . Alcohol use: No  . Drug use: No  . Sexual activity: Yes  Other Topics Concern  . Not on file  Social History Narrative  . Not on file   Social Determinants of Health   Financial Resource Strain: Not on file  Food Insecurity: Not on file  Transportation Needs: Not on file  Physical Activity: Not on file  Stress: Not on file  Social Connections: Not on file  Intimate Partner Violence: Not on file    Outpatient Medications Prior to Visit  Medication Sig Dispense  Refill  . doxepin (SINEQUAN) 10 MG capsule Take 1 capsule (10 mg total) by mouth at bedtime as needed. 30 capsule 2  . estradiol (VIVELLE-DOT) 0.05 MG/24HR patch 1 patch 2 (two) times a week.     . folic acid (FOLVITE) 1 MG tablet Take 1 mg by mouth daily.    Marland Kitchen levothyroxine (SYNTHROID) 125 MCG tablet Take 1 tablet (125 mcg total) by mouth daily. 90 tablet 3  . omega-3 acid ethyl esters (LOVAZA) 1 g capsule Take 2 capsules (2 g total) by mouth 2 (two) times daily. 180 capsule 3  . Progesterone Micronized 10 % CREA Place onto the skin.    . pravastatin (PRAVACHOL) 20 MG tablet Take 1 tablet (20 mg total) by mouth daily after supper. (Patient not taking: Reported on 12/08/2020) 90 tablet 1  . Suvorexant (BELSOMRA) 10 MG TABS Take 10 mg by mouth at bedtime. 30 minutes before bedtime 30 tablet 2   No facility-administered medications prior to visit.    Allergies  Allergen Reactions  . Augmentin [Amoxicillin-Pot Clavulanate]   . Doxycycline   . Erythromycin   . Penicillins     Review of Systems  Constitutional: Negative for fever.  Eyes: Positive for pain. Negative for blurred vision, double vision, photophobia, discharge, redness and visual disturbance.  All other systems  reviewed and are negative.      Objective:    Physical Exam Vitals and nursing note reviewed.  Constitutional:      Appearance: Normal appearance.  HENT:     Head: Normocephalic.     Nose: Nose normal.  Eyes:     General: No visual field deficit or scleral icterus.       Right eye: No foreign body or discharge.     Conjunctiva/sclera: Conjunctivae normal.      Comments: Swollen eye ball, decreased pupillary reflex  Cardiovascular:     Rate and Rhythm: Normal rate and regular rhythm.     Pulses: Normal pulses.     Heart sounds: Normal heart sounds.  Pulmonary:     Effort: Pulmonary effort is normal.     Breath sounds: Normal breath sounds.  Abdominal:     General: Bowel sounds are normal.  Neurological:      Mental Status: She is alert and oriented to person, place, and time.  Psychiatric:        Behavior: Behavior normal.     BP 121/71   Pulse 73   Temp 97.8 F (36.6 C) (Temporal)   Ht 5\' 1"  (1.549 m)   Wt 143 lb (64.9 kg)   SpO2 98%   BMI 27.02 kg/m  Wt Readings from Last 3 Encounters:  02/18/21 143 lb (64.9 kg)  12/08/20 147 lb (66.7 kg)  08/04/20 146 lb (66.2 kg)    Health Maintenance Due  Topic Date Due  . URINE MICROALBUMIN  Never done    There are no preventive care reminders to display for this patient.   Lab Results  Component Value Date   TSH 1.500 12/08/2020   Lab Results  Component Value Date   WBC 6.6 12/08/2020   HGB 15.3 12/08/2020   HCT 44.1 12/08/2020   MCV 87 12/08/2020   PLT 231 12/08/2020   Lab Results  Component Value Date   NA 140 12/08/2020   K 4.8 12/08/2020   CO2 22 12/08/2020   GLUCOSE 87 12/08/2020   BUN 19 12/08/2020   CREATININE 1.13 (H) 12/08/2020   BILITOT 0.6 12/08/2020   ALKPHOS 65 12/08/2020   AST 20 12/08/2020   ALT 18 12/08/2020   PROT 6.9 12/08/2020   ALBUMIN 4.5 12/08/2020   CALCIUM 9.7 12/08/2020   Lab Results  Component Value Date   CHOL 227 (H) 12/08/2020   Lab Results  Component Value Date   HDL 69 12/08/2020   Lab Results  Component Value Date   LDLCALC 131 (H) 12/08/2020   Lab Results  Component Value Date   TRIG 152 (H) 12/08/2020   Lab Results  Component Value Date   CHOLHDL 3.3 12/08/2020   No results found for: HGBA1C     Assessment & Plan:   Problem List Items Addressed This Visit      Other   S/p bilateral blepharoplasty - Primary    New symptoms of swelling, discomfort and feelings of gitty pebble in right eye.  Patient is 5 weeks S/P blepharoplasty.  Advised patient to go to [facility] for evaluation.  Completed referral to ophthalmology STAT.  Follow-up with eye surgeon.  Use cold compress on clean , Tylenol/ibuprofen for pain.  Follow-up with worsening  unresolved symptoms  washcloth         No orders of the defined types were placed in this encounter.    Ivy Lynn, NP

## 2021-02-18 NOTE — Addendum Note (Signed)
Addended by: Ivy Lynn on: 02/18/2021 09:15 PM   Modules accepted: Orders

## 2021-02-23 NOTE — Addendum Note (Signed)
Addended by: Ivy Lynn on: 02/23/2021 02:09 PM   Modules accepted: Level of Service

## 2021-02-24 DIAGNOSIS — E038 Other specified hypothyroidism: Secondary | ICD-10-CM | POA: Diagnosis not present

## 2021-02-24 DIAGNOSIS — H0520 Unspecified exophthalmos: Secondary | ICD-10-CM | POA: Diagnosis not present

## 2021-02-24 DIAGNOSIS — H43393 Other vitreous opacities, bilateral: Secondary | ICD-10-CM | POA: Diagnosis not present

## 2021-02-24 DIAGNOSIS — H04123 Dry eye syndrome of bilateral lacrimal glands: Secondary | ICD-10-CM | POA: Diagnosis not present

## 2021-03-24 DIAGNOSIS — H0520 Unspecified exophthalmos: Secondary | ICD-10-CM | POA: Diagnosis not present

## 2021-03-24 DIAGNOSIS — E0500 Thyrotoxicosis with diffuse goiter without thyrotoxic crisis or storm: Secondary | ICD-10-CM | POA: Diagnosis not present

## 2021-03-24 DIAGNOSIS — H11429 Conjunctival edema, unspecified eye: Secondary | ICD-10-CM | POA: Diagnosis not present

## 2021-03-28 DIAGNOSIS — E039 Hypothyroidism, unspecified: Secondary | ICD-10-CM | POA: Diagnosis not present

## 2021-03-28 DIAGNOSIS — N951 Menopausal and female climacteric states: Secondary | ICD-10-CM | POA: Diagnosis not present

## 2021-03-28 DIAGNOSIS — G4700 Insomnia, unspecified: Secondary | ICD-10-CM | POA: Diagnosis not present

## 2021-03-28 DIAGNOSIS — Z131 Encounter for screening for diabetes mellitus: Secondary | ICD-10-CM | POA: Diagnosis not present

## 2021-03-28 DIAGNOSIS — E559 Vitamin D deficiency, unspecified: Secondary | ICD-10-CM | POA: Diagnosis not present

## 2021-03-28 DIAGNOSIS — N959 Unspecified menopausal and perimenopausal disorder: Secondary | ICD-10-CM | POA: Diagnosis not present

## 2021-03-28 DIAGNOSIS — E538 Deficiency of other specified B group vitamins: Secondary | ICD-10-CM | POA: Diagnosis not present

## 2021-04-06 DIAGNOSIS — E039 Hypothyroidism, unspecified: Secondary | ICD-10-CM | POA: Diagnosis not present

## 2021-04-06 DIAGNOSIS — G4700 Insomnia, unspecified: Secondary | ICD-10-CM | POA: Diagnosis not present

## 2021-04-06 DIAGNOSIS — E559 Vitamin D deficiency, unspecified: Secondary | ICD-10-CM | POA: Diagnosis not present

## 2021-04-06 DIAGNOSIS — N959 Unspecified menopausal and perimenopausal disorder: Secondary | ICD-10-CM | POA: Diagnosis not present

## 2021-04-12 DIAGNOSIS — H02831 Dermatochalasis of right upper eyelid: Secondary | ICD-10-CM | POA: Diagnosis not present

## 2021-04-12 DIAGNOSIS — E0500 Thyrotoxicosis with diffuse goiter without thyrotoxic crisis or storm: Secondary | ICD-10-CM | POA: Diagnosis not present

## 2021-04-12 DIAGNOSIS — H02834 Dermatochalasis of left upper eyelid: Secondary | ICD-10-CM | POA: Diagnosis not present

## 2021-04-12 DIAGNOSIS — H02423 Myogenic ptosis of bilateral eyelids: Secondary | ICD-10-CM | POA: Diagnosis not present

## 2021-04-12 DIAGNOSIS — H02413 Mechanical ptosis of bilateral eyelids: Secondary | ICD-10-CM | POA: Diagnosis not present

## 2021-04-22 DIAGNOSIS — H0520 Unspecified exophthalmos: Secondary | ICD-10-CM | POA: Diagnosis not present

## 2021-04-22 DIAGNOSIS — E0500 Thyrotoxicosis with diffuse goiter without thyrotoxic crisis or storm: Secondary | ICD-10-CM | POA: Diagnosis not present

## 2021-04-26 DIAGNOSIS — N959 Unspecified menopausal and perimenopausal disorder: Secondary | ICD-10-CM | POA: Diagnosis not present

## 2021-04-26 DIAGNOSIS — N952 Postmenopausal atrophic vaginitis: Secondary | ICD-10-CM | POA: Diagnosis not present

## 2021-04-26 DIAGNOSIS — L821 Other seborrheic keratosis: Secondary | ICD-10-CM | POA: Diagnosis not present

## 2021-04-26 DIAGNOSIS — Z6827 Body mass index (BMI) 27.0-27.9, adult: Secondary | ICD-10-CM | POA: Diagnosis not present

## 2021-04-26 DIAGNOSIS — D2372 Other benign neoplasm of skin of left lower limb, including hip: Secondary | ICD-10-CM | POA: Diagnosis not present

## 2021-04-26 DIAGNOSIS — L720 Epidermal cyst: Secondary | ICD-10-CM | POA: Diagnosis not present

## 2021-04-26 DIAGNOSIS — L578 Other skin changes due to chronic exposure to nonionizing radiation: Secondary | ICD-10-CM | POA: Diagnosis not present

## 2021-04-26 DIAGNOSIS — Z01419 Encounter for gynecological examination (general) (routine) without abnormal findings: Secondary | ICD-10-CM | POA: Diagnosis not present

## 2021-04-27 DIAGNOSIS — Z01419 Encounter for gynecological examination (general) (routine) without abnormal findings: Secondary | ICD-10-CM | POA: Diagnosis not present

## 2021-05-10 DIAGNOSIS — H02831 Dermatochalasis of right upper eyelid: Secondary | ICD-10-CM | POA: Diagnosis not present

## 2021-05-10 DIAGNOSIS — H02413 Mechanical ptosis of bilateral eyelids: Secondary | ICD-10-CM | POA: Diagnosis not present

## 2021-05-10 DIAGNOSIS — E0500 Thyrotoxicosis with diffuse goiter without thyrotoxic crisis or storm: Secondary | ICD-10-CM | POA: Diagnosis not present

## 2021-05-10 DIAGNOSIS — H02834 Dermatochalasis of left upper eyelid: Secondary | ICD-10-CM | POA: Diagnosis not present

## 2021-05-10 DIAGNOSIS — H02534 Eyelid retraction left upper eyelid: Secondary | ICD-10-CM | POA: Diagnosis not present

## 2021-05-31 DIAGNOSIS — H02531 Eyelid retraction right upper eyelid: Secondary | ICD-10-CM | POA: Diagnosis not present

## 2021-06-14 DIAGNOSIS — H02531 Eyelid retraction right upper eyelid: Secondary | ICD-10-CM | POA: Diagnosis not present

## 2021-06-14 DIAGNOSIS — H02413 Mechanical ptosis of bilateral eyelids: Secondary | ICD-10-CM | POA: Diagnosis not present

## 2021-06-14 DIAGNOSIS — H02831 Dermatochalasis of right upper eyelid: Secondary | ICD-10-CM | POA: Diagnosis not present

## 2021-06-14 DIAGNOSIS — H02834 Dermatochalasis of left upper eyelid: Secondary | ICD-10-CM | POA: Diagnosis not present

## 2021-06-14 DIAGNOSIS — E0500 Thyrotoxicosis with diffuse goiter without thyrotoxic crisis or storm: Secondary | ICD-10-CM | POA: Diagnosis not present

## 2021-06-28 ENCOUNTER — Ambulatory Visit: Payer: BC Managed Care – PPO | Admitting: Dermatology

## 2021-06-28 ENCOUNTER — Encounter: Payer: Self-pay | Admitting: Dermatology

## 2021-06-28 ENCOUNTER — Other Ambulatory Visit: Payer: Self-pay

## 2021-06-28 DIAGNOSIS — D2372 Other benign neoplasm of skin of left lower limb, including hip: Secondary | ICD-10-CM | POA: Diagnosis not present

## 2021-06-28 DIAGNOSIS — L920 Granuloma annulare: Secondary | ICD-10-CM | POA: Diagnosis not present

## 2021-06-28 DIAGNOSIS — L821 Other seborrheic keratosis: Secondary | ICD-10-CM | POA: Diagnosis not present

## 2021-06-28 DIAGNOSIS — D239 Other benign neoplasm of skin, unspecified: Secondary | ICD-10-CM

## 2021-06-28 DIAGNOSIS — Z1283 Encounter for screening for malignant neoplasm of skin: Secondary | ICD-10-CM

## 2021-06-28 NOTE — Patient Instructions (Signed)
Dermatofibroma left calf Granuloma  annulare left elbow Keratoses on the back

## 2021-07-02 ENCOUNTER — Encounter: Payer: Self-pay | Admitting: Dermatology

## 2021-07-02 ENCOUNTER — Telehealth: Payer: BC Managed Care – PPO | Admitting: Nurse Practitioner

## 2021-07-02 DIAGNOSIS — N3000 Acute cystitis without hematuria: Secondary | ICD-10-CM | POA: Diagnosis not present

## 2021-07-02 MED ORDER — CEPHALEXIN 500 MG PO CAPS: 500.0000 mg | ORAL_CAPSULE | Freq: Two times a day (BID) | ORAL | 0 refills | Status: DC

## 2021-07-02 NOTE — Progress Notes (Signed)

## 2021-07-02 NOTE — Progress Notes (Signed)
   New Patient   Subjective  Christina Barnes is a 65 y.o. female who presents for the following: Skin Problem (Left forehead- x 2 months- no itch no bleed,).  General skin examination, several places she would like checked. Location:  Duration:  Quality:  Associated Signs/Symptoms: Modifying Factors:  Severity:  Timing: Context:    The following portions of the chart were reviewed this encounter and updated as appropriate:  Tobacco  Allergies  Meds  Problems  Med Hx  Surg Hx  Fam Hx      Objective  Well appearing patient in no apparent distress; mood and affect are within normal limits. General skin examination: No atypical pigmented lesions or nonmelanoma skin cancer.  Left Elbow - Posterior Clustered 2-3 4 mm nontender, nonfluctuant flesh-colored dermal papules; possible GA (differential includes sarcoidosis).  Left Lower Leg - Posterior 5 mm firm pink dermal papule  Torso - Posterior (Back) Half dozen 4-8 mm brown flattopped textured papules    A full examination was performed including scalp, head, eyes, ears, nose, lips, neck, chest, axillae, abdomen, back, buttocks, bilateral upper extremities, bilateral lower extremities, hands, feet, fingers, toes, fingernails, and toenails. All findings within normal limits unless otherwise noted below.  Is beneath undergarments not fully examined.   Assessment & Plan  Granuloma annulare Left Elbow - Posterior  Patient was told that in order to establish diagnosis punch biopsy is necessary but this may not help with the treatment.  She will think this over and decide whether she wants this done.  Dermatofibroma Left Lower Leg - Posterior  May leave if stable.  Seborrheic keratosis Torso - Posterior (Back)  Leave if stable  Encounter for screening for malignant neoplasm of skin  Annual skin examination.  Encouraged to self examine twice annually.  Continue ultraviolet protection.

## 2021-07-28 DIAGNOSIS — H02534 Eyelid retraction left upper eyelid: Secondary | ICD-10-CM | POA: Diagnosis not present

## 2021-07-28 DIAGNOSIS — H02531 Eyelid retraction right upper eyelid: Secondary | ICD-10-CM | POA: Diagnosis not present

## 2021-07-28 DIAGNOSIS — H0520 Unspecified exophthalmos: Secondary | ICD-10-CM | POA: Diagnosis not present

## 2021-08-26 IMAGING — MG MM DIGITAL DIAGNOSTIC UNILAT*L* W/ TOMO W/ CAD
6 series · 6 of 14 positions shown · non-contrast
Comparison: Previous exam(s).

CLINICAL DATA: Screening recall for a left breast asymmetry and
calcifications.

EXAM:
DIGITAL DIAGNOSTIC LEFT MAMMOGRAM WITH CAD AND TOMO
ULTRASOUND LEFT BREAST

[L ML]
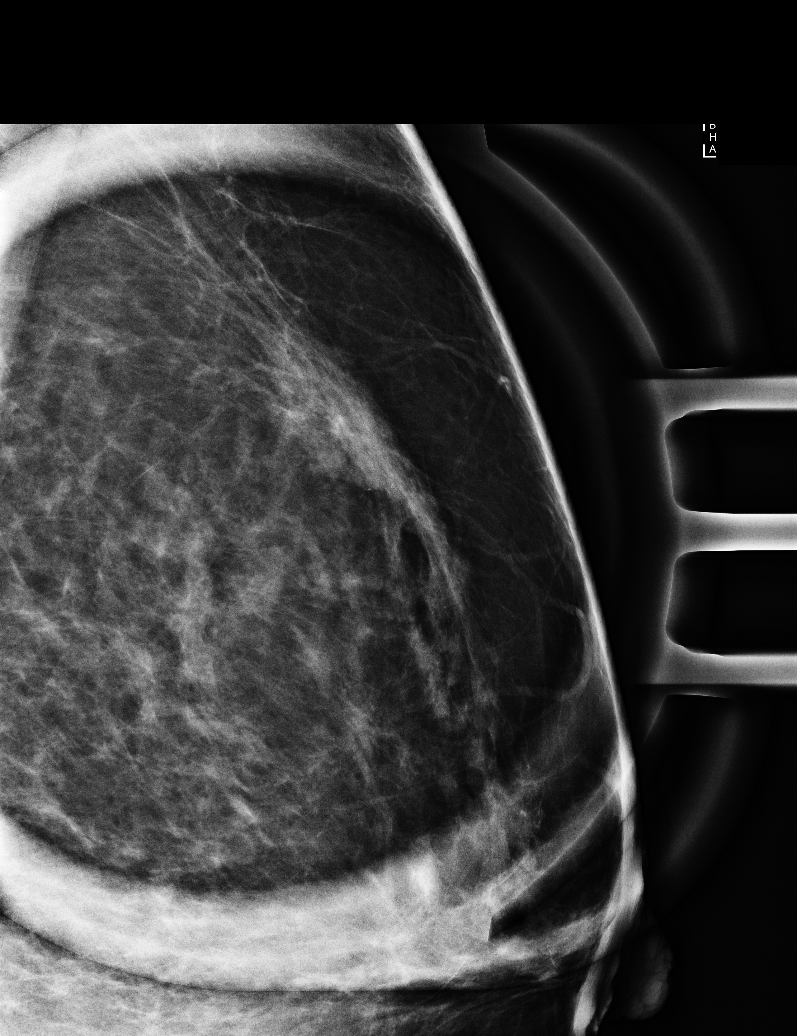

[L CC]
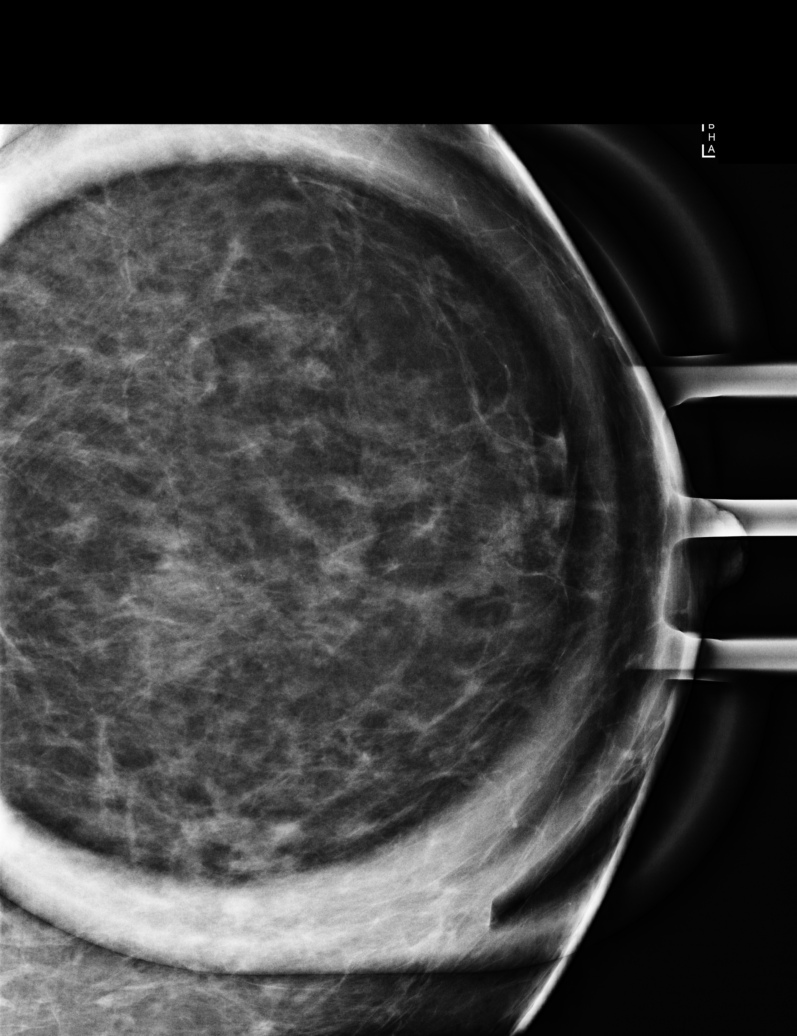

[L CC synth-2D]
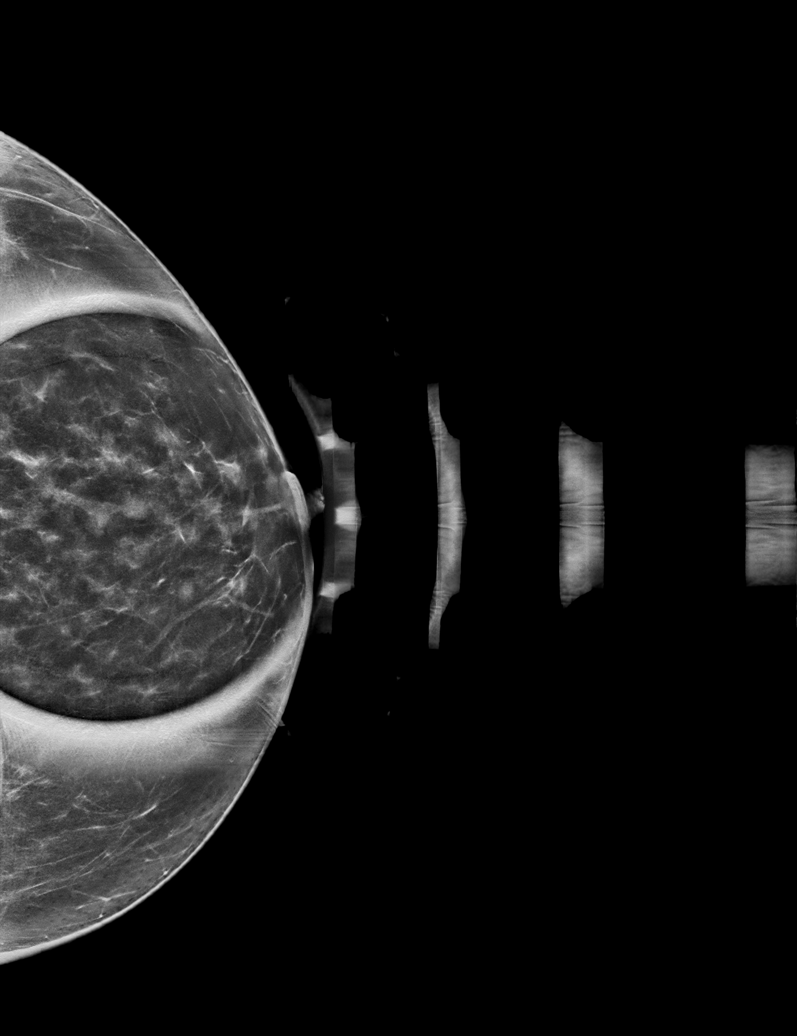

[L ML synth-2D]
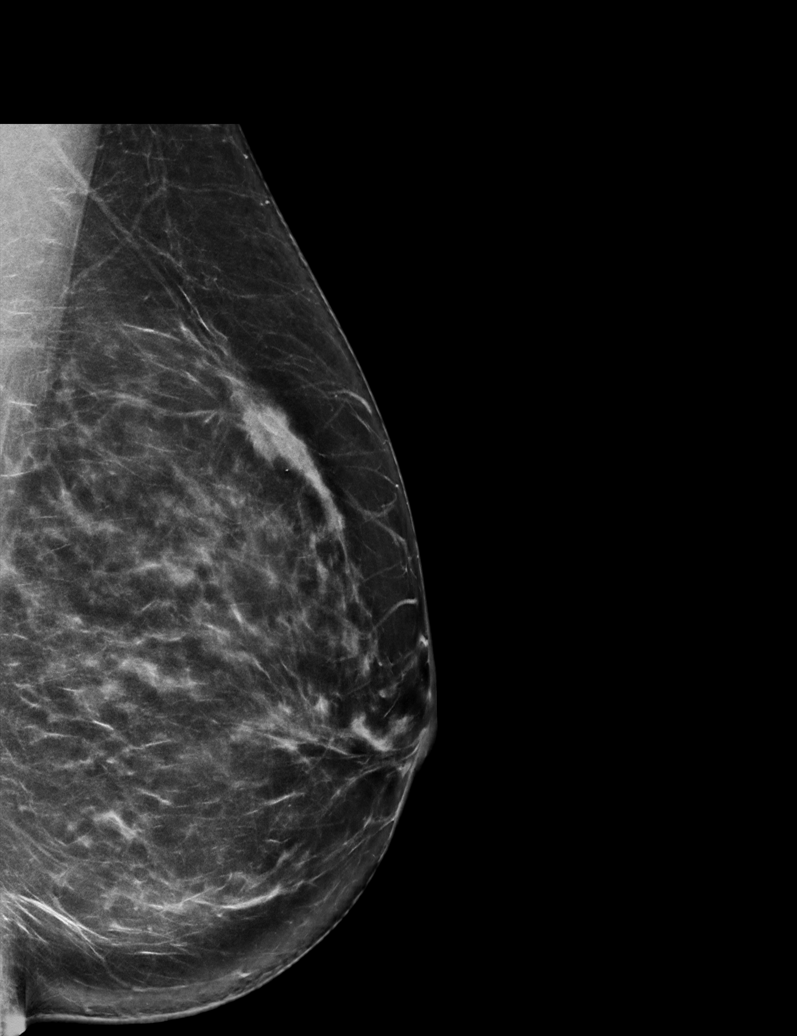

[L CC tomo · tomo slice 32/63.0]
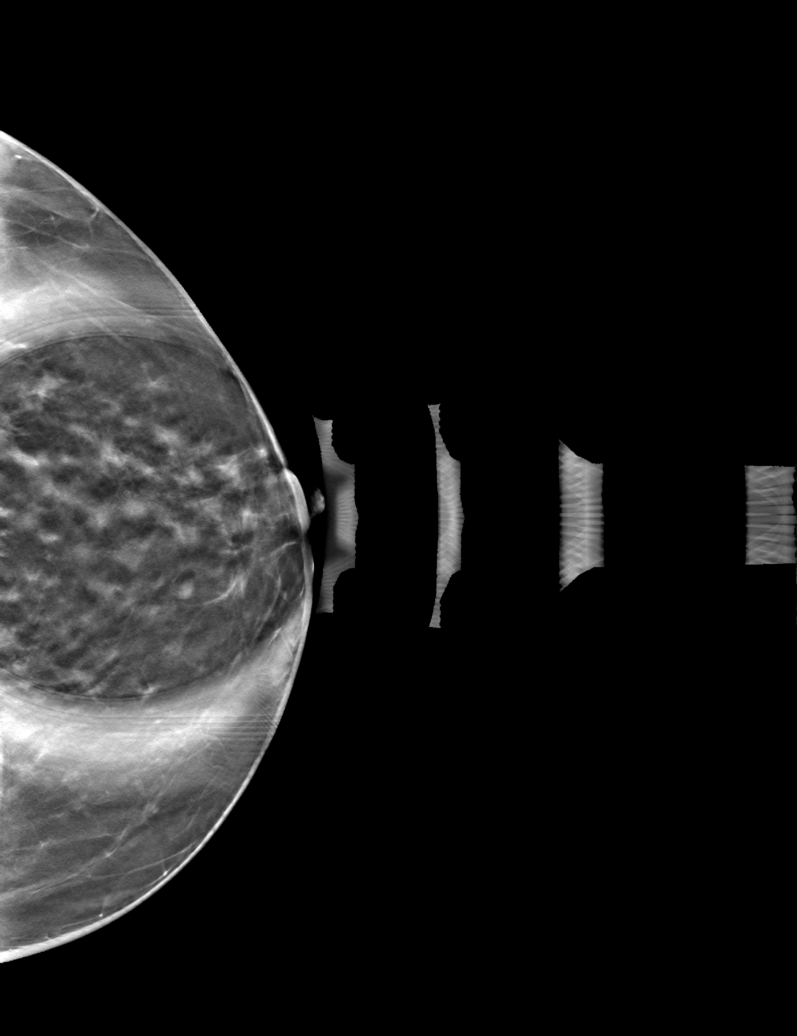

[L ML tomo · tomo slice 38/75.0]
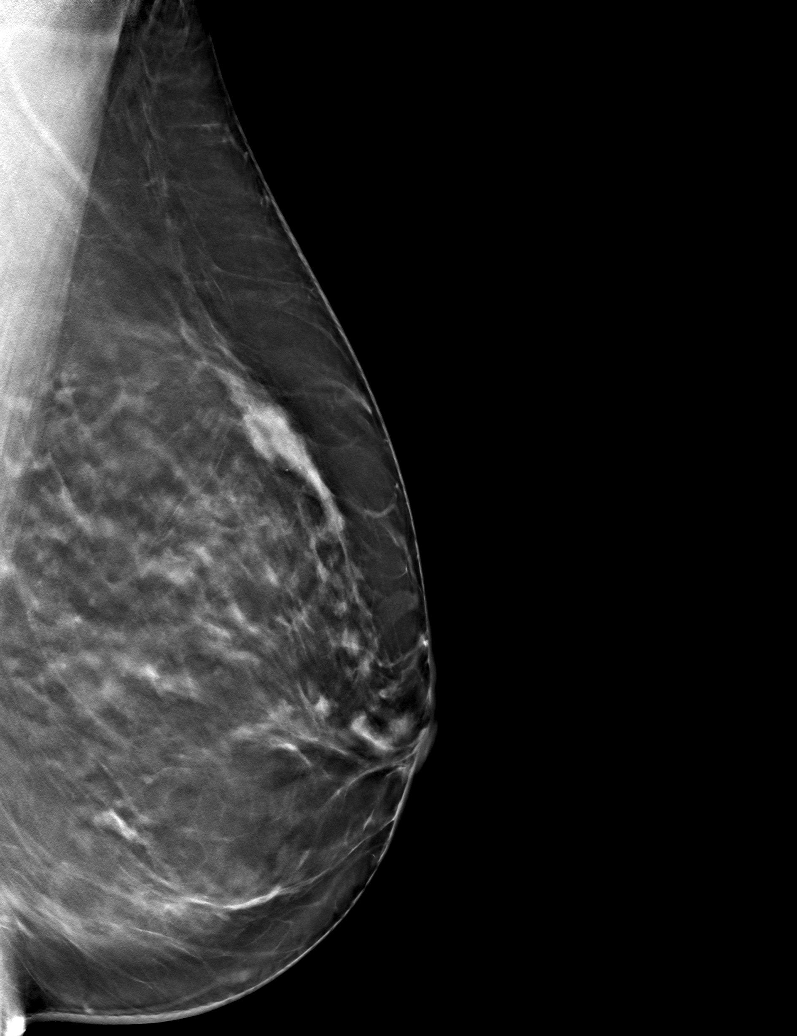

[6 of 14 positions shown; findings below may reference images not displayed]

ACR Breast Density Category c: The breast tissue is heterogeneously
dense, which may obscure small masses.
FINDINGS: Spot compression magnification images of the superior left breast
demonstrates a few faint amorphous calcifications, 1 area of which
layers on the true lateral view consistent with milk of calcium.
Overall, there is no significant change from 7870. There is a
persistent subcentimeter oval obscured mass in the superior anterior
left breast.

Mammographic images were processed with CAD.

Ultrasound of the left breast at 12 o'clock, 1 cm from the nipple
demonstrates an anechoic oval circumscribed mass measuring 6 x 3 x 4
mm. There are several other scattered and clustered anechoic cysts
in the superior left breast.
IMPRESSION: 1. Fibrocystic change with benign milk of calcium corresponds with
the mass and calcifications in the left breast.

RECOMMENDATION:
Screening mammogram in one year.(Code:5A-R-MBI)

I have discussed the findings and recommendations with the patient.
If applicable, a reminder letter will be sent to the patient
regarding the next appointment.

BI-RADS CATEGORY  2: Benign.

## 2021-09-27 ENCOUNTER — Telehealth: Payer: BC Managed Care – PPO | Admitting: Emergency Medicine

## 2021-09-27 NOTE — Progress Notes (Signed)
Covid positive. I messaged her she would need video visit if I interested in antivirals. Have not heard back. Will close evisit.

## 2021-09-28 ENCOUNTER — Telehealth: Payer: BC Managed Care – PPO | Admitting: Nurse Practitioner

## 2022-10-12 ENCOUNTER — Encounter: Payer: Self-pay | Admitting: Family Medicine

## 2022-10-12 ENCOUNTER — Ambulatory Visit (INDEPENDENT_AMBULATORY_CARE_PROVIDER_SITE_OTHER): Payer: Medicare Other | Admitting: Family Medicine

## 2022-10-12 VITALS — BP 112/74 | HR 74 | Temp 98.3°F | Ht 61.0 in | Wt 149.0 lb

## 2022-10-12 DIAGNOSIS — R635 Abnormal weight gain: Secondary | ICD-10-CM | POA: Diagnosis not present

## 2022-10-12 DIAGNOSIS — M170 Bilateral primary osteoarthritis of knee: Secondary | ICD-10-CM | POA: Diagnosis not present

## 2022-10-12 DIAGNOSIS — E890 Postprocedural hypothyroidism: Secondary | ICD-10-CM | POA: Diagnosis not present

## 2022-10-12 DIAGNOSIS — E663 Overweight: Secondary | ICD-10-CM

## 2022-10-12 NOTE — Progress Notes (Signed)
BP 112/74   Pulse 74   Temp 98.3 F (36.8 C)   Ht _0  (1.549 m)   Wt 149 lb (67.6 kg)   SpO2 99%   BMI 28.15 kg/m    Subjective:   Patient ID: Christina Barnes, female    DOB: [birth year], 66 y.o.   MRN: [id number]  HPI: Christina Barnes is a 66 y.o. female presenting on 10/12/2022 for Medical Management of Chronic Issues and weight loss management   HPI Weight gain and overweight Patient is coming in today to discuss weight gain and being overweight.  She does she has gained about 15 pounds over the past year and she would like to lose that weight.  She is currently on 149 pounds she would like to be 130 pounds.  She says it started to affect her knees and she is feeling more pressure and pain with her knees when she is trying to be active.  She thinks is because of her weight and she would like to try semaglutide.  She has found a compound pharmacy.  Relevant past medical, surgical, family and social history reviewed and updated as indicated. Interim medical history since our last visit reviewed. Allergies and medications reviewed and updated.  Review of Systems  Constitutional:  Negative for chills and fever.  Eyes:  Negative for redness and visual disturbance.  Respiratory:  Negative for chest tightness and shortness of breath.   Cardiovascular:  Negative for chest pain and leg swelling.  Musculoskeletal:  Negative for back pain and gait problem.  Skin:  Negative for rash.  Neurological:  Negative for light-headedness and headaches.  Psychiatric/Behavioral:  Negative for agitation and behavioral problems.   All other systems reviewed and are negative.   Per HPI unless specifically indicated above   Allergies as of 10/12/2022       Reactions   Augmentin [amoxicillin-pot Clavulanate]    Doxycycline    Erythromycin    Penicillins         Medication List        Accurate as of October 12, 2022 10:34 AM. If you have any questions, ask your nurse or  doctor.          STOP taking these medications    cephALEXin 500 MG capsule Commonly known as: Keflex Stopped by: Fransisca Kaufmann Topanga Alvelo, MD       TAKE these medications    doxepin 10 MG capsule Commonly known as: SINEQUAN Take 1 capsule (10 mg total) by mouth at bedtime as needed.   estradiol 0.05 MG/24HR patch Commonly known as: VIVELLE-DOT 1 patch 2 (two) times a week.   folic acid 1 MG tablet Commonly known as: FOLVITE Take 1 mg by mouth daily.   levothyroxine 125 MCG tablet Commonly known as: SYNTHROID Take 1 tablet (125 mcg total) by mouth daily.   omega-3 acid ethyl esters 1 g capsule Commonly known as: LOVAZA Take 2 capsules (2 g total) by mouth 2 (two) times daily.   Progesterone Micronized 10 % Crea Place onto the skin.         Objective:   BP 112/74   Pulse 74   Temp 98.3 F (36.8 C)   Ht _1  (1.549 m)   Wt 149 lb (67.6 kg)   SpO2 99%   BMI 28.15 kg/m   Wt Readings from Last 3 Encounters:  10/12/22 149 lb (67.6 kg)  02/18/21 143 lb (64.9 kg)  12/08/20 147 lb (66.7 kg)  Physical Exam Vitals and nursing note reviewed.  Constitutional:      General: She is not in acute distress.    Appearance: She is well-developed. She is not diaphoretic.  Eyes:     Conjunctiva/sclera: Conjunctivae normal.  Cardiovascular:     Rate and Rhythm: Normal rate and regular rhythm.     Heart sounds: Normal heart sounds. No murmur heard. Pulmonary:     Effort: Pulmonary effort is normal. No respiratory distress.     Breath sounds: Normal breath sounds. No wheezing.  Musculoskeletal:        General: No swelling or tenderness. Normal range of motion.  Skin:    General: Skin is warm and dry.     Findings: No rash.  Neurological:     Mental Status: She is alert and oriented to person, place, and time.     Coordination: Coordination normal.  Psychiatric:        Behavior: Behavior normal.       Assessment & Plan:   Problem List Items Addressed This  Visit       Endocrine   Postablative hypothyroidism   Other Visit Diagnoses     Overweight    -  Primary   Relevant Orders   CBC with Differential/Platelet   CMP14+EGFR   Thyroid Panel With TSH   Weight gain       Relevant Orders   CBC with Differential/Platelet   CMP14+EGFR   Thyroid Panel With TSH   Primary osteoarthritis of both knees           Will try compounded semaglutide.  She will call if she has any side effects. Follow up plan: Return in about 3 months (around 01/11/2023), or if symptoms worsen or fail to improve, for Weight recheck.  Counseling provided for all of the vaccine components Orders Placed This Encounter  Procedures   CBC with Differential/Platelet   CMP14+EGFR   Thyroid Panel With TSH    Caryl Pina, MD [facility] 10/12/2022, 10:34 AM

## 2022-10-13 LAB — CBC WITH DIFFERENTIAL/PLATELET
Basophils Absolute: 0 10*3/uL (ref 0.0–0.2)
Basos: 1 %
EOS (ABSOLUTE): 0.2 10*3/uL (ref 0.0–0.4)
Eos: 5 %
Hematocrit: 42.4 % (ref 34.0–46.6)
Hemoglobin: 14.5 g/dL (ref 11.1–15.9)
Immature Grans (Abs): 0 10*3/uL (ref 0.0–0.1)
Immature Granulocytes: 0 %
Lymphocytes Absolute: 1.4 10*3/uL (ref 0.7–3.1)
Lymphs: 30 %
MCH: 30 pg (ref 26.6–33.0)
MCHC: 34.2 g/dL (ref 31.5–35.7)
MCV: 88 fL (ref 79–97)
Monocytes Absolute: 0.5 10*3/uL (ref 0.1–0.9)
Monocytes: 11 %
Neutrophils Absolute: 2.5 10*3/uL (ref 1.4–7.0)
Neutrophils: 53 %
Platelets: 191 10*3/uL (ref 150–450)
RBC: 4.84 x10E6/uL (ref 3.77–5.28)
RDW: 12.7 % (ref 11.7–15.4)
WBC: 4.7 10*3/uL (ref 3.4–10.8)

## 2022-10-13 LAB — CMP14+EGFR
ALT: 19 IU/L (ref 0–32)
AST: 18 IU/L (ref 0–40)
Albumin/Globulin Ratio: 2.1 (ref 1.2–2.2)
Albumin: 4.2 g/dL (ref 3.9–4.9)
Alkaline Phosphatase: 65 IU/L (ref 44–121)
BUN/Creatinine Ratio: 11 — ABNORMAL LOW (ref 12–28)
BUN: 13 mg/dL (ref 8–27)
Bilirubin Total: 0.4 mg/dL (ref 0.0–1.2)
CO2: 22 mmol/L (ref 20–29)
Calcium: 9.5 mg/dL (ref 8.7–10.3)
Chloride: 105 mmol/L (ref 96–106)
Creatinine, Ser: 1.21 mg/dL — ABNORMAL HIGH (ref 0.57–1.00)
Globulin, Total: 2 g/dL (ref 1.5–4.5)
Glucose: 69 mg/dL — ABNORMAL LOW (ref 70–99)
Potassium: 4.2 mmol/L (ref 3.5–5.2)
Sodium: 142 mmol/L (ref 134–144)
Total Protein: 6.2 g/dL (ref 6.0–8.5)
eGFR: 50 mL/min/{1.73_m2} — ABNORMAL LOW (ref >59)

## 2022-10-13 LAB — THYROID PANEL WITH TSH
Free Thyroxine Index: 3.3 (ref 1.2–4.9)
T3 Uptake Ratio: 33 % (ref 24–39)
T4, Total: 9.9 ug/dL (ref 4.5–12.0)
TSH: 4.85 u[IU]/mL — ABNORMAL HIGH (ref 0.450–4.500)

## 2022-10-13 MED ORDER — LEVOTHYROXINE SODIUM 137 MCG PO TABS: 137.0000 ug | ORAL_TABLET | Freq: Every day | ORAL | 1 refills | Status: DC

## 2022-10-13 NOTE — Addendum Note (Signed)
Addended by: Karle Plumber on: 10/13/2022 09:39 AM   Modules accepted: Orders

## 2022-11-28 LAB — HM MAMMOGRAPHY

## 2022-12-14 ENCOUNTER — Encounter: Payer: Self-pay | Admitting: Family Medicine

## 2022-12-25 ENCOUNTER — Telehealth: Payer: Self-pay | Admitting: Family Medicine

## 2023-01-15 ENCOUNTER — Encounter: Payer: Self-pay | Admitting: Family Medicine

## 2023-01-15 ENCOUNTER — Ambulatory Visit (INDEPENDENT_AMBULATORY_CARE_PROVIDER_SITE_OTHER): Payer: Medicare Other | Admitting: Family Medicine

## 2023-01-15 VITALS — BP 116/72 | HR 70 | Ht 61.0 in | Wt 147.0 lb

## 2023-01-15 DIAGNOSIS — E663 Overweight: Secondary | ICD-10-CM | POA: Diagnosis not present

## 2023-01-15 DIAGNOSIS — E890 Postprocedural hypothyroidism: Secondary | ICD-10-CM

## 2023-01-15 NOTE — Progress Notes (Signed)
BP 116/72   Pulse 70   Ht 5\' 1"  (1.549 m)   Wt 147 lb (66.7 kg)   SpO2 97%   BMI 27.78 kg/m    Subjective:   Patient ID: Christina Barnes, female    DOB: [birth year], 67 y.o.   MRN: [id number]  HPI: Christina Barnes is a 67 y.o. female presenting on 01/15/2023 for Weight Check (149lb 09/2022)   HPI Hypothyroidism recheck Patient wants to wait to get her thyroid checked with her endocrinologist.  She says she was missing doses before.  Weight recheck Patient is coming in for weight recheck.  She got semaglutide sublingual from a compound pharmacy and is paying out-of-pocket for it.  She does not feel like this weight loss medicine has been helping her as much and she wants to try for an injectable form and she will work on bringing notes forms on how to order that injectable semaglutide compounded from [facility].  Relevant past medical, surgical, family and social history reviewed and updated as indicated. Interim medical history since our last visit reviewed. Allergies and medications reviewed and updated.  Review of Systems  Constitutional:  Negative for chills and fever.  HENT:  Negative for congestion, ear discharge and ear pain.   Eyes:  Negative for redness and visual disturbance.  Respiratory:  Negative for chest tightness and shortness of breath.   Cardiovascular:  Negative for chest pain and leg swelling.  Genitourinary:  Negative for difficulty urinating and dysuria.  Musculoskeletal:  Negative for back pain and gait problem.  Skin:  Negative for rash.  Neurological:  Negative for light-headedness and headaches.  Psychiatric/Behavioral:  Negative for agitation and behavioral problems.   All other systems reviewed and are negative.   Per HPI unless specifically indicated above   Allergies as of 01/15/2023       Reactions   Augmentin [amoxicillin-pot Clavulanate]    Doxycycline    Erythromycin    Penicillins         Medication List         Accurate as of January 15, 2023  4:13 PM. If you have any questions, ask your nurse or doctor.          STOP taking these medications    doxepin 10 MG capsule Commonly known as: SINEQUAN Stopped by: Fransisca Kaufmann Amoree Newlon, MD       TAKE these medications    estradiol 0.05 MG/24HR patch Commonly known as: VIVELLE-DOT 1 patch 2 (two) times a week.   folic acid 1 MG tablet Commonly known as: FOLVITE Take 1 mg by mouth daily.   levothyroxine 137 MCG tablet Commonly known as: SYNTHROID Take 1 tablet (137 mcg total) by mouth daily before breakfast.   omega-3 acid ethyl esters 1 g capsule Commonly known as: LOVAZA Take 2 capsules (2 g total) by mouth 2 (two) times daily.   Progesterone Micronized 10 % Crea Place onto the skin.   Semaglutide(0.25 or 0.5MG /DOS) 2 MG/1.5ML Sopn Inject 1 mL into the skin daily.         Objective:   BP 116/72   Pulse 70   Ht 5\' 1"  (1.549 m)   Wt 147 lb (66.7 kg)   SpO2 97%   BMI 27.78 kg/m   Wt Readings from Last 3 Encounters:  01/15/23 147 lb (66.7 kg)  10/12/22 149 lb (67.6 kg)  02/18/21 143 lb (64.9 kg)    Physical Exam Vitals and nursing note reviewed.  Constitutional:  General: She is not in acute distress.    Appearance: She is well-developed. She is not diaphoretic.  Eyes:     Conjunctiva/sclera: Conjunctivae normal.  Cardiovascular:     Rate and Rhythm: Normal rate and regular rhythm.     Heart sounds: Normal heart sounds. No murmur heard. Pulmonary:     Effort: Pulmonary effort is normal. No respiratory distress.     Breath sounds: Normal breath sounds. No wheezing.  Musculoskeletal:        General: No tenderness. Normal range of motion.  Skin:    General: Skin is warm and dry.     Findings: No rash.  Neurological:     Mental Status: She is alert and oriented to person, place, and time.     Coordination: Coordination normal.  Psychiatric:        Behavior: Behavior normal.       Assessment & Plan:    Problem List Items Addressed This Visit       Endocrine   Postablative hypothyroidism - Primary   Other Visit Diagnoses     Overweight         Wants to try for injectable semaglutide.  She says she wants to get her thyroid checked with her endocrinologist and did not want to check today.  Follow up plan: Return in about 3 months (around 04/17/2023), or if symptoms worsen or fail to improve, for Weight and thyroid recheck.  Counseling provided for all of the vaccine components No orders of the defined types were placed in this encounter.   Caryl Pina, MD [facility] 01/15/2023, 4:13 PM

## 2023-02-20 ENCOUNTER — Telehealth: Payer: Self-pay | Admitting: Family Medicine

## 2023-02-20 NOTE — Telephone Encounter (Signed)
Pt will need an appointment to have this removed. Please call patient and schedule an appointment.

## 2023-02-20 NOTE — Telephone Encounter (Signed)
Patient aware that she would need to be seen to have removed. I did instruct patient on how to remove the tick at home as well.

## 2023-04-19 ENCOUNTER — Encounter: Payer: Self-pay | Admitting: Family Medicine

## 2023-04-19 ENCOUNTER — Ambulatory Visit (INDEPENDENT_AMBULATORY_CARE_PROVIDER_SITE_OTHER): Payer: Medicare Other | Admitting: Family Medicine

## 2023-04-19 VITALS — BP 123/74 | HR 63 | Ht 61.0 in | Wt 137.0 lb

## 2023-04-19 DIAGNOSIS — E890 Postprocedural hypothyroidism: Secondary | ICD-10-CM | POA: Diagnosis not present

## 2023-04-19 DIAGNOSIS — E782 Mixed hyperlipidemia: Secondary | ICD-10-CM

## 2023-04-19 NOTE — Progress Notes (Signed)
BP 123/74   Pulse 63   Ht 5\' 1"  (1.549 m)   Wt 137 lb (62.1 kg)   SpO2 95%   BMI 25.89 kg/m    Subjective:   Patient ID: Christina Barnes, female    DOB: Oct 22, 1956, 67 y.o.   MRN: [id number]  HPI: Christina Barnes is a 67 y.o. female presenting on 04/19/2023 for Weight Loss   HPI Weight loss Patient has started from another clinic semaglutide for weight loss.  She has lost 10 pounds in the past 3 months and has a BMI of 25.89 now.  Hypothyroidism recheck Patient is coming in for thyroid recheck today as well. They deny any issues with [place] changes or heat or cold problems or diarrhea or constipation. They deny any chest pain or palpitations. They are currently on levothyroxine 137 micrograms   Hyperlipidemia Patient is coming in for recheck of his hyperlipidemia. The patient is currently taking Lovaza. They deny any issues with myalgias or history of liver damage from it. They deny any focal numbness or weakness or chest pain.   Relevant past medical, surgical, family and social history reviewed and updated as indicated. Interim medical history since our last visit reviewed. Allergies and medications reviewed and updated.  Review of Systems  Constitutional:  Negative for chills and fever.  Eyes:  Negative for visual disturbance.  Respiratory:  Negative for chest tightness and shortness of breath.   Cardiovascular:  Negative for chest pain and leg swelling.  Musculoskeletal:  Negative for back pain and gait problem.  Skin:  Negative for rash.  Neurological:  Negative for dizziness, light-headedness and headaches.  Psychiatric/Behavioral:  Negative for agitation and behavioral problems.   All other systems reviewed and are negative.   Per HPI unless specifically indicated above   Allergies as of 04/19/2023       Reactions   Augmentin [amoxicillin-pot Clavulanate]    Doxycycline    Erythromycin    Penicillins         Medication List        Accurate  as of April 19, 2023  4:18 PM. If you have any questions, ask your nurse or doctor.          estradiol 0.05 MG/24HR patch Commonly known as: VIVELLE-DOT 1 patch 2 (two) times a week.   folic acid 1 MG tablet Commonly known as: FOLVITE Take 1 mg by mouth daily.   levothyroxine 137 MCG tablet Commonly known as: SYNTHROID Take 1 tablet (137 mcg total) by mouth daily before breakfast.   omega-3 acid ethyl esters 1 g capsule Commonly known as: LOVAZA Take 2 capsules (2 g total) by mouth 2 (two) times daily.   Progesterone Micronized 10 % Crea Place onto the skin.   Semaglutide(0.25 or 0.5MG /DOS) 2 MG/1.5ML Sopn Inject 1 mL into the skin daily.         Objective:   BP 123/74   Pulse 63   Ht 5\' 1"  (1.549 m)   Wt 137 lb (62.1 kg)   SpO2 95%   BMI 25.89 kg/m   Wt Readings from Last 3 Encounters:  04/19/23 137 lb (62.1 kg)  01/15/23 147 lb (66.7 kg)  10/12/22 149 lb (67.6 kg)    Physical Exam Vitals and nursing note reviewed.  Constitutional:      General: She is not in acute distress.    Appearance: She is well-developed. She is not diaphoretic.  Eyes:     Conjunctiva/sclera: Conjunctivae normal.  Cardiovascular:  Rate and Rhythm: Normal rate and regular rhythm.     Heart sounds: Normal heart sounds. No murmur heard. Pulmonary:     Effort: Pulmonary effort is normal. No respiratory distress.     Breath sounds: Normal breath sounds. No wheezing.  Musculoskeletal:        General: No swelling or tenderness. Normal range of motion.  Skin:    General: Skin is warm and dry.     Findings: No rash.  Neurological:     Mental Status: She is alert and oriented to person, place, and time.     Coordination: Coordination normal.  Psychiatric:        Behavior: Behavior normal.       Assessment & Plan:   Problem List Items Addressed This Visit       Endocrine   Postablative hypothyroidism - Primary   Relevant Orders   CBC with Differential/Platelet    CMP14+EGFR   Lipid panel   Thyroid Panel With TSH     Other   Hyperlipidemia   Relevant Orders   CBC with Differential/Platelet   CMP14+EGFR   Lipid panel  Continue current medicine, will check blood work levels for the thyroid, she has not seen her endocrinologist so we will adjust if we need to here  Follow up plan: Return in about 3 months (around 07/20/2023), or if symptoms worsen or fail to improve, for Weight recheck and thyroid.  Counseling provided for all of the vaccine components Orders Placed This Encounter  Procedures   CBC with Differential/Platelet   CMP14+EGFR   Lipid panel   Thyroid Panel With TSH    Arville Care, MD [facility] 04/19/2023, 4:18 PM

## 2023-04-20 LAB — LIPID PANEL
Chol/HDL Ratio: 3.6 ratio (ref 0.0–4.4)
Cholesterol, Total: 229 mg/dL — ABNORMAL HIGH (ref 100–199)
HDL: 64 mg/dL (ref >39)
LDL Chol Calc (NIH): 144 mg/dL — ABNORMAL HIGH (ref 0–99)
Triglycerides: 121 mg/dL (ref 0–149)
VLDL Cholesterol Cal: 21 mg/dL (ref 5–40)

## 2023-04-20 LAB — CMP14+EGFR
ALT: 19 IU/L (ref 0–32)
AST: 17 IU/L (ref 0–40)
Albumin: 4.1 g/dL (ref 3.9–4.9)
Alkaline Phosphatase: 66 IU/L (ref 44–121)
BUN/Creatinine Ratio: 15 (ref 12–28)
BUN: 18 mg/dL (ref 8–27)
Bilirubin Total: 0.3 mg/dL (ref 0.0–1.2)
CO2: 24 mmol/L (ref 20–29)
Calcium: 9.4 mg/dL (ref 8.7–10.3)
Chloride: 103 mmol/L (ref 96–106)
Creatinine, Ser: 1.18 mg/dL — ABNORMAL HIGH (ref 0.57–1.00)
Globulin, Total: 2.2 g/dL (ref 1.5–4.5)
Glucose: 123 mg/dL — ABNORMAL HIGH (ref 70–99)
Potassium: 4.2 mmol/L (ref 3.5–5.2)
Sodium: 140 mmol/L (ref 134–144)
Total Protein: 6.3 g/dL (ref 6.0–8.5)
eGFR: 51 mL/min/{1.73_m2} — ABNORMAL LOW (ref >59)

## 2023-04-20 LAB — CBC WITH DIFFERENTIAL/PLATELET
Basophils Absolute: 0 10*3/uL (ref 0.0–0.2)
Basos: 1 %
EOS (ABSOLUTE): 0.2 10*3/uL (ref 0.0–0.4)
Eos: 3 %
Hematocrit: 42.6 % (ref 34.0–46.6)
Hemoglobin: 14.3 g/dL (ref 11.1–15.9)
Immature Grans (Abs): 0 10*3/uL (ref 0.0–0.1)
Immature Granulocytes: 0 %
Lymphocytes Absolute: 2.1 10*3/uL (ref 0.7–3.1)
Lymphs: 34 %
MCH: 29.3 pg (ref 26.6–33.0)
MCHC: 33.6 g/dL (ref 31.5–35.7)
MCV: 87 fL (ref 79–97)
Monocytes Absolute: 0.3 10*3/uL (ref 0.1–0.9)
Monocytes: 6 %
Neutrophils Absolute: 3.5 10*3/uL (ref 1.4–7.0)
Neutrophils: 56 %
Platelets: 212 10*3/uL (ref 150–450)
RBC: 4.88 x10E6/uL (ref 3.77–5.28)
RDW: 13 % (ref 11.7–15.4)
WBC: 6.1 10*3/uL (ref 3.4–10.8)

## 2023-04-20 LAB — THYROID PANEL WITH TSH
Free Thyroxine Index: 2.6 (ref 1.2–4.9)
T3 Uptake Ratio: 27 % (ref 24–39)
T4, Total: 9.6 ug/dL (ref 4.5–12.0)
TSH: 1.83 u[IU]/mL (ref 0.450–4.500)

## 2023-05-02 ENCOUNTER — Other Ambulatory Visit: Payer: Self-pay

## 2023-05-02 MED ORDER — ROSUVASTATIN CALCIUM 5 MG PO TABS: 5.0000 mg | ORAL_TABLET | Freq: Every evening | ORAL | 3 refills | Status: DC

## 2023-11-09 ENCOUNTER — Ambulatory Visit: Payer: BLUE CROSS/BLUE SHIELD | Admitting: Family Medicine

## 2023-11-22 ENCOUNTER — Ambulatory Visit: Payer: Medicare Other | Admitting: Family Medicine

## 2023-11-22 ENCOUNTER — Encounter: Payer: Self-pay | Admitting: Family Medicine

## 2023-11-22 VITALS — BP 109/70 | HR 67 | Ht 61.0 in | Wt 141.0 lb

## 2023-11-22 DIAGNOSIS — E782 Mixed hyperlipidemia: Secondary | ICD-10-CM | POA: Diagnosis not present

## 2023-11-22 DIAGNOSIS — D519 Vitamin B12 deficiency anemia, unspecified: Secondary | ICD-10-CM

## 2023-11-22 DIAGNOSIS — R635 Abnormal weight gain: Secondary | ICD-10-CM | POA: Diagnosis not present

## 2023-11-22 DIAGNOSIS — E890 Postprocedural hypothyroidism: Secondary | ICD-10-CM | POA: Diagnosis not present

## 2023-11-22 DIAGNOSIS — E663 Overweight: Secondary | ICD-10-CM

## 2023-11-22 MED ORDER — LEVOTHYROXINE SODIUM 137 MCG PO TABS: 137.0000 ug | ORAL_TABLET | Freq: Every day | ORAL | 3 refills | Status: AC

## 2023-11-22 MED ORDER — ROSUVASTATIN CALCIUM 5 MG PO TABS: 5.0000 mg | ORAL_TABLET | Freq: Every evening | ORAL | 3 refills | Status: AC

## 2023-11-22 MED ORDER — SEMAGLUTIDE(0.25 OR 0.5MG/DOS) 2 MG/1.5ML ~~LOC~~ SOPN: 0.5000 mg | PEN_INJECTOR | Freq: Every day | SUBCUTANEOUS | 6 refills | Status: AC

## 2023-11-22 NOTE — Progress Notes (Signed)
BP 109/70   Pulse 67   Ht 5\' 1"  (1.549 m)   Wt 141 lb (64 kg)   SpO2 95%   BMI 26.64 kg/m    Subjective:   Patient ID: Christina Barnes, female    DOB: 03/18/1956, 68 y.o.   MRN: [id number]  HPI: Christina Barnes is a 68 y.o. female presenting on 11/22/2023 for Medical Management of Chronic Issues and Hypothyroidism   HPI Hypothyroidism recheck Patient is coming in for thyroid recheck today as well. They deny any issues with [place] changes or heat or cold problems or diarrhea or constipation. They deny any chest pain or palpitations. They are currently on levothyroxine   Hyperlipidemia Patient is coming in for recheck of his hyperlipidemia. The patient is currently taking fish oils and Crestor. They deny any issues with myalgias or history of liver damage from it. They deny any focal numbness or weakness or chest pain.   B12 anemia Patient is having a recheck for B12 and anemia.  Patient is currently taking semaglutide compounded to help with weight loss and is coming in for recheck and reevaluation for that.  Patient currently is at 141 pounds which is down from 147 pounds about a year ago.  Her BMI today is 26.64  Relevant past medical, surgical, family and social history reviewed and updated as indicated. Interim medical history since our last visit reviewed. Allergies and medications reviewed and updated.  Review of Systems  Constitutional:  Negative for chills and fever.  Eyes:  Negative for visual disturbance.  Respiratory:  Negative for chest tightness and shortness of breath.   Cardiovascular:  Negative for chest pain and leg swelling.  Gastrointestinal:  Negative for abdominal pain.  Genitourinary:  Negative for difficulty urinating and dysuria.  Musculoskeletal:  Negative for back pain and gait problem.  Skin:  Negative for rash.  Neurological:  Negative for dizziness, light-headedness and headaches.  Psychiatric/Behavioral:  Negative for  agitation and behavioral problems.   All other systems reviewed and are negative.   Per HPI unless specifically indicated above   Allergies as of 11/22/2023       Reactions   Augmentin [amoxicillin-pot Clavulanate]    Doxycycline    Erythromycin    Penicillins         Medication List        Accurate as of November 22, 2023  3:56 PM. If you have any questions, ask your nurse or doctor.          estradiol 0.05 MG/24HR patch Commonly known as: VIVELLE-DOT 1 patch 2 (two) times a week.   folic acid 1 MG tablet Commonly known as: FOLVITE Take 1 mg by mouth daily.   levothyroxine 137 MCG tablet Commonly known as: SYNTHROID Take 1 tablet (137 mcg total) by mouth daily before breakfast.   omega-3 acid ethyl esters 1 g capsule Commonly known as: LOVAZA Take 2 capsules (2 g total) by mouth 2 (two) times daily.   Progesterone Micronized 10 % Crea Place onto the skin.   rosuvastatin 5 MG tablet Commonly known as: Crestor Take 1 tablet (5 mg total) by mouth at bedtime.   Semaglutide(0.25 or 0.5MG /DOS) 2 MG/1.5ML Sopn Inject 0.5 mg into the skin daily. What changed: how much to take Changed by: Elige Radon Kinzley Savell         Objective:   BP 109/70   Pulse 67   Ht 5\' 1"  (1.549 m)   Wt 141 lb (64 kg)  SpO2 95%   BMI 26.64 kg/m   Wt Readings from Last 3 Encounters:  11/22/23 141 lb (64 kg)  04/19/23 137 lb (62.1 kg)  01/15/23 147 lb (66.7 kg)    Physical Exam Vitals and nursing note reviewed.  Constitutional:      General: She is not in acute distress.    Appearance: She is well-developed. She is not diaphoretic.  Eyes:     Conjunctiva/sclera: Conjunctivae normal.  Cardiovascular:     Rate and Rhythm: Normal rate and regular rhythm.     Heart sounds: Normal heart sounds. No murmur heard. Pulmonary:     Effort: Pulmonary effort is normal. No respiratory distress.     Breath sounds: Normal breath sounds. No wheezing.  Musculoskeletal:        General:  No swelling.  Skin:    General: Skin is warm and dry.     Findings: No rash.  Neurological:     Mental Status: She is alert and oriented to person, place, and time.     Coordination: Coordination normal.  Psychiatric:        Behavior: Behavior normal.       Assessment & Plan:   Problem List Items Addressed This Visit       Endocrine   Postablative hypothyroidism - Primary   Relevant Medications   levothyroxine (SYNTHROID) 137 MCG tablet   Other Relevant Orders   CBC with Differential/Platelet   CMP14+EGFR   TSH     Other   Hyperlipidemia   Relevant Medications   rosuvastatin (CRESTOR) 5 MG tablet   Other Relevant Orders   Lipid panel   B12 deficiency anemia   Relevant Orders   CBC with Differential/Platelet   Vitamin B12   Other Visit Diagnoses       Overweight       Relevant Medications   Semaglutide,0.25 or 0.5MG /DOS, 2 MG/1.5ML SOPN     Weight gain       Relevant Medications   Semaglutide,0.25 or 0.5MG /DOS, 2 MG/1.5ML SOPN       Patient says her goal that she would like is 125 pounds, for our target I would say be under 135 and over 130 would be a reasonable weight for her.  She feels like the medicine is working well for her and helps with appetite suppression and she is trying to do other dietary changes. Follow up plan: Return in about 6 months (around 05/21/2024), or if symptoms worsen or fail to improve, for Overweight recheck.  Counseling provided for all of the vaccine components Orders Placed This Encounter  Procedures   CBC with Differential/Platelet   CMP14+EGFR   Lipid panel   TSH   Vitamin B12    Arville Care, MD [place] [facility] 11/22/2023, 3:56 PM

## 2023-12-25 ENCOUNTER — Other Ambulatory Visit: Payer: Self-pay | Admitting: Family Medicine

## 2024-07-23 ENCOUNTER — Encounter: Payer: Self-pay | Admitting: Family Medicine
